# Patient Record
Sex: Male | Born: 1980
Health system: Southern US, Community
[De-identification: ages and names within clinical notes are randomized; demographics above are authoritative.]

## PROBLEM LIST (undated history)

## (undated) DIAGNOSIS — F329 Major depressive disorder, single episode, unspecified: Secondary | ICD-10-CM

## (undated) DIAGNOSIS — Z87442 Personal history of urinary calculi: Secondary | ICD-10-CM

## (undated) DIAGNOSIS — F32A Depression, unspecified: Secondary | ICD-10-CM

## (undated) HISTORY — PX: TONSILLECTOMY: SUR1361

---
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
## 2013-01-17 ENCOUNTER — Emergency Department (HOSPITAL_COMMUNITY): Payer: BC Managed Care – PPO

## 2013-01-17 ENCOUNTER — Encounter (HOSPITAL_COMMUNITY): Payer: Self-pay

## 2013-01-17 ENCOUNTER — Emergency Department (HOSPITAL_COMMUNITY)
Admission: EM | Admit: 2013-01-17 | Discharge: 2013-01-17 | Disposition: A | Discharge status: Home / Self Care | Payer: BC Managed Care – PPO | Attending: Emergency Medicine | Admitting: Emergency Medicine

## 2013-01-17 DIAGNOSIS — R300 Dysuria: Secondary | ICD-10-CM | POA: Insufficient documentation

## 2013-01-17 DIAGNOSIS — N209 Urinary calculus, unspecified: Secondary | ICD-10-CM | POA: Insufficient documentation

## 2013-01-17 DIAGNOSIS — R319 Hematuria, unspecified: Secondary | ICD-10-CM | POA: Insufficient documentation

## 2013-01-17 LAB — CBC WITH DIFFERENTIAL/PLATELET
Basophils Absolute: 0.1 10*3/uL (ref 0.0–0.1)
Basophils Relative: 0 % (ref 0–1)
Eosinophils Absolute: 0.1 10*3/uL (ref 0.0–0.7)
Eosinophils Relative: 1 % (ref 0–5)
HCT: 41.2 % (ref 39.0–52.0)
Hemoglobin: 14.3 g/dL (ref 13.0–17.0)
Lymphocytes Relative: 11 % — ABNORMAL LOW (ref 12–46)
Lymphs Abs: 1.5 10*3/uL (ref 0.7–4.0)
MCH: 30.5 pg (ref 26.0–34.0)
MCHC: 34.7 g/dL (ref 30.0–36.0)
MCV: 87.8 fL (ref 78.0–100.0)
Monocytes Absolute: 1.1 10*3/uL — ABNORMAL HIGH (ref 0.1–1.0)
Monocytes Relative: 8 % (ref 3–12)
Neutro Abs: 11.4 10*3/uL — ABNORMAL HIGH (ref 1.7–7.7)
Neutrophils Relative %: 81 % — ABNORMAL HIGH (ref 43–77)
Platelets: 198 10*3/uL (ref 150–400)
RBC: 4.69 MIL/uL (ref 4.22–5.81)
RDW: 12.8 % (ref 11.5–15.5)
WBC: 14.1 10*3/uL — ABNORMAL HIGH (ref 4.0–10.5)

## 2013-01-17 LAB — BASIC METABOLIC PANEL
CO2: 28 mEq/L (ref 19–32)
Chloride: 102 mEq/L (ref 96–112)
Creatinine, Ser: 0.9 mg/dL (ref 0.50–1.35)

## 2013-01-17 LAB — URINE MICROSCOPIC-ADD ON

## 2013-01-17 LAB — URINALYSIS, ROUTINE W REFLEX MICROSCOPIC
Glucose, UA: NEGATIVE mg/dL
Ketones, ur: 15 mg/dL — AB
pH: 6 (ref 5.0–8.0)

## 2013-01-17 MED ORDER — TAMSULOSIN HCL 0.4 MG PO CAPS: 0.4000 mg | ORAL_CAPSULE | Freq: Every day | ORAL | Status: DC

## 2013-01-17 MED ORDER — OXYCODONE-ACETAMINOPHEN 5-325 MG PO TABS: 1.0000 | ORAL_TABLET | ORAL | Status: DC | PRN

## 2013-01-17 MED ORDER — ONDANSETRON HCL 4 MG/2ML IJ SOLN
4.0000 mg | Freq: Once | INTRAMUSCULAR | Status: AC
  Administered 2013-01-17: 4 mg via INTRAVENOUS
  Filled 2013-01-17: qty 2

## 2013-01-17 MED ORDER — FENTANYL CITRATE 0.05 MG/ML IJ SOLN
50.0000 ug | Freq: Once | INTRAMUSCULAR | Status: AC
  Administered 2013-01-17: 50 ug via INTRAVENOUS
  Filled 2013-01-17: qty 2

## 2013-01-17 MED ORDER — HYDROMORPHONE HCL PF 1 MG/ML IJ SOLN
1.0000 mg | Freq: Once | INTRAMUSCULAR | Status: AC
  Administered 2013-01-17: 1 mg via INTRAVENOUS
  Filled 2013-01-17: qty 1

## 2013-01-17 NOTE — ED Provider Notes (Signed)
Medical screening examination/treatment/procedure(s) were performed by non-physician practitioner and as supervising physician I was immediately available for consultation/collaboration.  Haidy Kackley R. Elona Yinger, MD 01/17/13 0642 

## 2013-01-17 NOTE — ED Notes (Signed)
Pt. Reports left flank pain, n/v. States pain started around 0230. Difficulty urinating.

## 2013-01-17 NOTE — ED Notes (Signed)
PA at bedside.

## 2013-01-17 NOTE — ED Provider Notes (Signed)
History     CSN: [id number]  Arrival date & time 01/17/13  0351   First MD Initiated Contact with Patient 01/17/13 0425      Chief Complaint  Patient presents with  . Flank Pain    (Consider location/radiation/quality/duration/timing/severity/associated sxs/prior treatment) HPI  32 year old male presents to the emergency department with chief complaint of left flank pain.  Patient states that yesterday he was achy and that he had constipation.  He awoke at approximately 2 AM this morning with sudden onset left flank pain.  He states that the pain is severe.  It then began to radiate down to his left groin and testicle.  The patient states he has difficulty urinating and that his urine apparently CT tinged with blood.  The patient denies a history of previous kidney stones.  His father does have a history of kidney stones.  Denies fevers, chills, myalgias, arthralgias. Denies DOE, SOB, chest tightness or pressure, radiation to left arm, jaw or back, or diaphoresis. Denies headaches, light headedness, weakness, visual disturbances. Denies abdominal pain, nausea, vomiting, diarrhea or constipation.   History reviewed. No pertinent past medical history.  History reviewed. No pertinent past surgical history.  No family history on file.  History  Substance Use Topics  . Smoking status: Not on file  . Smokeless tobacco: Not on file  . Alcohol Use: Not on file      Review of Systems  Genitourinary: Positive for dysuria, hematuria and flank pain.  Ten systems reviewed and are negative for acute change, except as noted in the HPI.    Allergies  Review of patient's allergies indicates no known allergies.  Home Medications  No current outpatient prescriptions on file.  BP 126/86  Temp(Src) 98.1 F (36.7 C) (Oral)  Resp 18  SpO2 100%  Physical Exam  Nursing note and vitals reviewed. Constitutional: He appears well-developed and well-nourished. No distress.  HENT:  Head:  Normocephalic and atraumatic.  Eyes: Conjunctivae normal are normal. No scleral icterus.  Neck: Normal range of motion. Neck supple.  Cardiovascular: Normal rate, regular rhythm and normal heart sounds.   Pulmonary/Chest: Effort normal and breath sounds normal. No respiratory distress.  Abdominal: Soft.TTP LLQ. CVA tenderness L side. Musculoskeletal: He exhibits no edema.  Neurological: He is alert.  Skin: Skin is warm and dry. He is not diaphoretic.  Psychiatric: His behavior is normal.    ED Course  Procedures (including critical care time)  Labs Reviewed  CBC WITH DIFFERENTIAL - Abnormal; Notable for the following:    WBC 14.1 (*)    Neutrophils Relative 81 (*)    Neutro Abs 11.4 (*)    Lymphocytes Relative 11 (*)    Monocytes Absolute 1.1 (*)    All other components within normal limits  BASIC METABOLIC PANEL - Abnormal; Notable for the following:    Glucose, Bld 120 (*)    All other components within normal limits  URINALYSIS, ROUTINE W REFLEX MICROSCOPIC - Abnormal; Notable for the following:    Color, Urine BROWN (*)    APPearance CLOUDY (*)    Hgb urine dipstick LARGE (*)    Bilirubin Urine SMALL (*)    Ketones, ur 15 (*)    Protein, ur 30 (*)    Leukocytes, UA SMALL (*)    All other components within normal limits  URINE MICROSCOPIC-ADD ON   No results found.   No diagnosis found.    MDM  5:39 AM BP 114/76  Pulse 83  Temp(Src) 98.1 F (  36.7 C) (Oral)  Resp 18  SpO2 96% Patient with sxs concerning for kidney stone.  I do not suspect testicular torsion-no tenderness to palpation of the testicle. No concern for intestinal blockage, diverticulitis, or AAA. A febrile and unilateral flank pain. I do not suspect pyelonephritis.   6:15 AM Pt has been diagnosed with a Kidney Stone via CT. There is no evidence of significant hydronephrosis, serum creatine WNL, vitals sign stable and the pt does not have irratractable vomiting. Pt will be dc home with pain  medications & has been advised to follow up with PCP.       Arthor Captain, PA-C 01/17/13 [phone]

## 2015-01-15 ENCOUNTER — Other Ambulatory Visit: Payer: Self-pay | Admitting: Orthopaedic Surgery

## 2015-01-15 DIAGNOSIS — M25511 Pain in right shoulder: Secondary | ICD-10-CM

## 2015-01-24 ENCOUNTER — Other Ambulatory Visit: Payer: Self-pay

## 2015-01-25 ENCOUNTER — Ambulatory Visit
Admission: RE | Admit: 2015-01-25 | Discharge: 2015-01-25 | Disposition: A | Discharge status: Home / Self Care | Payer: BLUE CROSS/BLUE SHIELD | Source: Ambulatory Visit | Attending: Orthopaedic Surgery | Admitting: Orthopaedic Surgery

## 2015-01-25 ENCOUNTER — Other Ambulatory Visit: Payer: Self-pay | Admitting: Orthopaedic Surgery

## 2015-01-25 DIAGNOSIS — T1590XA Foreign body on external eye, part unspecified, unspecified eye, initial encounter: Secondary | ICD-10-CM

## 2015-01-25 DIAGNOSIS — M25511 Pain in right shoulder: Secondary | ICD-10-CM

## 2015-01-26 ENCOUNTER — Ambulatory Visit
Admission: RE | Admit: 2015-01-26 | Discharge: 2015-01-26 | Disposition: A | Discharge status: Home / Self Care | Payer: BLUE CROSS/BLUE SHIELD | Source: Ambulatory Visit | Attending: Orthopaedic Surgery | Admitting: Orthopaedic Surgery

## 2015-08-29 ENCOUNTER — Encounter (HOSPITAL_COMMUNITY): Payer: Self-pay | Admitting: Emergency Medicine

## 2015-08-29 ENCOUNTER — Emergency Department (HOSPITAL_COMMUNITY)
Admission: EM | Admit: 2015-08-29 | Discharge: 2015-08-29 | Disposition: A | Discharge status: Home / Self Care | Payer: BLUE CROSS/BLUE SHIELD | Attending: Emergency Medicine | Admitting: Emergency Medicine

## 2015-08-29 ENCOUNTER — Emergency Department (HOSPITAL_COMMUNITY): Payer: BLUE CROSS/BLUE SHIELD

## 2015-08-29 DIAGNOSIS — F430 Acute stress reaction: Secondary | ICD-10-CM | POA: Diagnosis not present

## 2015-08-29 DIAGNOSIS — G253 Myoclonus: Secondary | ICD-10-CM | POA: Diagnosis not present

## 2015-08-29 DIAGNOSIS — Z79899 Other long term (current) drug therapy: Secondary | ICD-10-CM | POA: Diagnosis not present

## 2015-08-29 DIAGNOSIS — F329 Major depressive disorder, single episode, unspecified: Secondary | ICD-10-CM | POA: Insufficient documentation

## 2015-08-29 DIAGNOSIS — R253 Fasciculation: Secondary | ICD-10-CM | POA: Diagnosis present

## 2015-08-29 HISTORY — DX: Depression, unspecified: F32.A

## 2015-08-29 HISTORY — DX: Major depressive disorder, single episode, unspecified: F32.9

## 2015-08-29 LAB — COMPREHENSIVE METABOLIC PANEL
ALBUMIN: 5.2 g/dL — AB (ref 3.5–5.0)
ALT: 49 U/L (ref 17–63)
ANION GAP: 8 (ref 5–15)
AST: 33 U/L (ref 15–41)
Alkaline Phosphatase: 84 U/L (ref 38–126)
BUN: 18 mg/dL (ref 6–20)
CHLORIDE: 103 mmol/L (ref 101–111)
CO2: 25 mmol/L (ref 22–32)
Calcium: 9.6 mg/dL (ref 8.9–10.3)
Creatinine, Ser: 0.91 mg/dL (ref 0.61–1.24)
GFR calc Af Amer: >60 mL/min (ref >60)
Glucose, Bld: 104 mg/dL — ABNORMAL HIGH (ref 65–99)
POTASSIUM: 4 mmol/L (ref 3.5–5.1)
Sodium: 136 mmol/L (ref 135–145)
Total Bilirubin: 1.5 mg/dL — ABNORMAL HIGH (ref 0.3–1.2)
Total Protein: 7.8 g/dL (ref 6.5–8.1)

## 2015-08-29 LAB — CBC WITH DIFFERENTIAL/PLATELET
BASOS ABS: 0.1 10*3/uL (ref 0.0–0.1)
BASOS PCT: 1 %
EOS PCT: 2 %
Eosinophils Absolute: 0.2 10*3/uL (ref 0.0–0.7)
HCT: 44.7 % (ref 39.0–52.0)
Hemoglobin: 15.5 g/dL (ref 13.0–17.0)
Lymphocytes Relative: 28 %
Lymphs Abs: 2.2 10*3/uL (ref 0.7–4.0)
MCH: 30.2 pg (ref 26.0–34.0)
MCHC: 34.7 g/dL (ref 30.0–36.0)
MCV: 87 fL (ref 78.0–100.0)
MONO ABS: 0.7 10*3/uL (ref 0.1–1.0)
Monocytes Relative: 8 %
Neutro Abs: 5 10*3/uL (ref 1.7–7.7)
Neutrophils Relative %: 61 %
PLATELETS: 256 10*3/uL (ref 150–400)
RBC: 5.14 MIL/uL (ref 4.22–5.81)
RDW: 12.3 % (ref 11.5–15.5)
WBC: 8 10*3/uL (ref 4.0–10.5)

## 2015-08-29 LAB — RAPID URINE DRUG SCREEN, HOSP PERFORMED
AMPHETAMINES: NOT DETECTED
BENZODIAZEPINES: NOT DETECTED
Barbiturates: NOT DETECTED
Cocaine: NOT DETECTED
OPIATES: NOT DETECTED
TETRAHYDROCANNABINOL: NOT DETECTED

## 2015-08-29 MED ORDER — CLONAZEPAM 0.5 MG PO TABS: 0.5000 mg | ORAL_TABLET | Freq: Two times a day (BID) | ORAL | Status: DC | PRN

## 2015-08-29 MED ORDER — SODIUM CHLORIDE 0.9 % IV BOLUS (SEPSIS)
1000.0000 mL | Freq: Once | INTRAVENOUS | Status: AC
  Administered 2015-08-29: 1000 mL via INTRAVENOUS

## 2015-08-29 MED ORDER — LORAZEPAM 1 MG PO TABS
1.0000 mg | ORAL_TABLET | Freq: Once | ORAL | Status: DC
  Filled 2015-08-29: qty 1

## 2015-08-29 MED ORDER — LORAZEPAM 2 MG/ML IJ SOLN
1.0000 mg | Freq: Once | INTRAMUSCULAR | Status: AC
  Administered 2015-08-29: 1 mg via INTRAVENOUS
  Filled 2015-08-29: qty 1

## 2015-08-29 NOTE — ED Provider Notes (Signed)
CSN: [id number]     Arrival date & time 08/29/15  0930 History   First MD Initiated Contact with Patient 08/29/15 [phone]     Chief Complaint  Patient presents with  . twitch      (Consider location/radiation/quality/duration/timing/severity/associated sxs/prior Treatment) HPI Comments: Spasms lastng 1sec beginning around 730AM Become more frequent and severe Forceful blinking of left eye, left facial spasm, left hand cramping and squeezing No new medications/drugs No hx of seizure or HAs Under a lot of stress over last month, dad died and a lot of work stress Hx of anxiety/depression/stress for which he is on wellbutrin without change No prior hx of episodes like this No fam hx of neurologic disorders/no hx of huntingtons No recent illness   Past Medical History  Diagnosis Date  . Depression    History reviewed. No pertinent past surgical history. No family history on file. Social History  Substance Use Topics  . Smoking status: Never Smoker   . Smokeless tobacco: None  . Alcohol Use: No    Review of Systems  Constitutional: Negative for fever.  HENT: Negative for sore throat.   Eyes: Negative for visual disturbance.  Respiratory: Negative for shortness of breath.   Cardiovascular: Negative for chest pain.  Gastrointestinal: Negative for nausea, vomiting, abdominal pain, diarrhea and constipation.  Genitourinary: Negative for difficulty urinating.  Musculoskeletal: Negative for back pain and neck stiffness.  Skin: Negative for rash.  Neurological: Positive for facial asymmetry (with blinking/spasm of left face). Negative for dizziness, seizures, syncope, speech difficulty, weakness, light-headedness, numbness and headaches. Tremors: spasm of left face and left hand.      Allergies  Review of patient's allergies indicates no known allergies.  Home Medications   Prior to Admission medications   Medication Sig Start Date End Date Taking? Authorizing Provider   buPROPion (WELLBUTRIN) 100 MG tablet Take 100 mg by mouth 2 (two) times daily.   Yes Historical Provider, MD  ibuprofen (ADVIL,MOTRIN) 200 MG tablet Take 400 mg by mouth every 6 (six) hours as needed for mild pain.   Yes Historical Provider, MD  clonazePAM (KLONOPIN) 0.5 MG tablet Take 1 tablet (0.5 mg total) by mouth 2 (two) times daily as needed for anxiety. 08/29/15   Alvira MondayErin Jamion Carter, MD  oxyCODONE-acetaminophen (PERCOCET) 5-325 MG per tablet Take 1-2 tablets by mouth every 4 (four) hours as needed for pain. Patient not taking: Reported on 08/29/2015 01/17/13   Arthor CaptainAbigail Harris, PA-C  tamsulosin (FLOMAX) 0.4 MG CAPS Take 1 capsule (0.4 mg total) by mouth daily. Patient not taking: Reported on 08/29/2015 01/17/13   Arthor CaptainAbigail Harris, PA-C   BP 113/66 mmHg  Pulse 98  Temp(Src) 98.2 F (36.8 C) (Oral)  Resp 13  SpO2 97% Physical Exam  Constitutional: He is oriented to person, place, and time. He appears well-developed and well-nourished. No distress.  HENT:  Head: Normocephalic and atraumatic.  Eyes: Conjunctivae and EOM are normal.  Neck: Normal range of motion.  Cardiovascular: Normal rate, regular rhythm, normal heart sounds and intact distal pulses.  Exam reveals no gallop and no friction rub.   No murmur heard. Pulmonary/Chest: Effort normal and breath sounds normal. No respiratory distress. He has no wheezes. He has no rales.  Abdominal: Soft. He exhibits no distension. There is no tenderness. There is no guarding.  Musculoskeletal: He exhibits no edema.  Neurological: He is alert and oriented to person, place, and time. He has normal strength. No cranial nerve deficit or sensory deficit. Coordination normal. GCS eye subscore  is 4. GCS verbal subscore is 5. GCS motor subscore is 6.  Normal neurologic exam with exception of frequent spasm of left side of face, twitching, and left hand clenching  Skin: Skin is warm and dry. He is not diaphoretic.  Nursing note and vitals reviewed.   ED  Course  Procedures (including critical care time) Labs Review Labs Reviewed  COMPREHENSIVE METABOLIC PANEL - Abnormal; Notable for the following:    Glucose, Bld 104 (*)    Albumin 5.2 (*)    Total Bilirubin 1.5 (*)    All other components within normal limits  CBC WITH DIFFERENTIAL/PLATELET  URINE RAPID DRUG SCREEN, HOSP PERFORMED    Imaging Review Mr Brain Wo Contrast  08/29/2015  CLINICAL DATA:  Right facial and right hand myoclonus and twitching EXAM: MRI HEAD WITHOUT CONTRAST TECHNIQUE: Multiplanar, multiecho pulse sequences of the brain and surrounding structures were obtained without intravenous contrast. COMPARISON:  None. FINDINGS: Image quality degraded by mild motion Ventricle size normal. Cerebral volume normal. Pituitary normal in size. Negative for Chiari malformation. Negative for acute or chronic infarction Negative for demyelinating disease. Cerebral white matter normal. Brainstem normal Negative for intracranial hemorrhage.  Negative for mass or edema Large retention cyst left max maxillary sinus. Mild mucosal thickening right maxillary sinus. IMPRESSION: Normal MRI of the brain without contrast Sinus mucosal disease. Electronically Signed   By: Marlan Palau M.D.   On: 08/29/2015 11:56   I have personally reviewed and evaluated these images and lab results as part of my medical decision-making.   EKG Interpretation   Date/Time:  Thursday August 29 2015 09:52:19 EDT Ventricular Rate:  113 PR Interval:  154 QRS Duration: 105 QT Interval:  347 QTC Calculation: 476 R Axis:   -73 Text Interpretation:  Sinus tachycardia Probable left atrial enlargement  Left axis deviation RSR' in V1 or V2, probably normal variant Artifact  Confirmed by [facility] MD, Char Feltman ([id number]) on 08/29/2015 6:44:51 PM      MDM   Final diagnoses:  Myoclonic jerking  Acute stress disorder   34 year old male with history of anxiety and Wellbutrin presents with concern of left facial and left  hand twitching which began this morning.  Denies any cocaine or amphetamine use, and UDS negative.   No headache and doubt intracranial hemorrhage.  Electrolytes are within normal limits.  No recent change in medications, and doubt serotonin syndrome.  Given patient with focal myoclonic jerking, which may represent partial seizures with possible intracranial focus, MRI brain was ordered.  Other etiologies of symptoms include a stress reaction or conversion disorder. Doubt Creutzfeldt-jakob disease, other abnormalities.   MRI obtained showing no sign of intracranial abnormality. Given setting of patient under severe acute distress, and not acknowledging the symptoms initially, conversion disorder is in the differential diagnosis. Discussed with patient and family, and recommend outpatient neurology follow-up for evaluation of other etiologies of myoclonus.  Given short course of clonazepam.  Discussed reasons to return in detail. Patient discharged in stable condition with understanding of reasons to return.     Alvira Monday, MD 08/29/15 [phone]

## 2015-08-29 NOTE — ED Notes (Signed)
Per co-worker, states she noticed facial twitching and his voice changing-sounded like he has he hiccups-patient is unaware of symptoms

## 2015-08-29 NOTE — Discharge Instructions (Signed)
°  Myoclonus °Myoclonus is a term that refers to brief, involuntary twitching or jerking of a muscle or a group of muscles. It describes a symptom, and generally, is not a diagnosis of a disease. The myoclonic twitches or jerks are usually caused by sudden muscle contractions. They also can result from brief lapses of contraction. Myoclonic twitches or jerks may occur: °· Alone or in sequence. °· In a pattern or without pattern. °· Infrequently or many times each minute. °Often times, myoclonus is one of several symptoms in a wide variety of nervous system disorders such as: °· Multiple sclerosis. °· Parkinson's disease. °· Alzheimer's disease. °· Creutzfeldt-Jakob disease. °Familiar examples of normal myoclonus include: °· Hiccups and jerks. °· "Sleep starts" that some people have while drifting off to sleep. °Severe cases can severely limit a person's ability to: °· Eat. °· Talk. °· Walk. °Myoclonic jerks commonly occur in individuals with epilepsy. The most common types of myoclonus include: °· Action. °· Cortical reflex. °· Essential. °· Palatal. °· Progressive myoclonus epilepsy. °· Reticular reflex. °· Sleep. °· Stimulus-sensitive. °TREATMENT  °Treatment for myoclonus consists of medicines that may help reduce symptoms. These drugs (many of which are also used to treat epilepsy) include:  °· Barbiturates. °· Clonazepam. °· Phenytoin. °· Primidone. °· Sodium valproate. °The complex origins of myoclonus may require the use of multiple drugs. °  °This information is not intended to replace advice given to you by your health care provider. Make sure you discuss any questions you have with your health care provider. °  °Document Released: 10/23/2002 Document Revised: 01/25/2012 Document Reviewed: 10/05/2013 °Elsevier Interactive Patient Education ©2016 Elsevier Inc. ° ° °

## 2015-08-29 NOTE — ED Notes (Signed)
MD at bedside. 

## 2015-08-29 NOTE — Progress Notes (Addendum)
[facility] ED CM noted pt with coverage but no pcp listed  [facility] ED CM spoke with pt on how to obtain an in network pcp with insurance coverage via the customer service number or web site  Cm reviewed ED level of care for crisis/emergent services and community pcp level of care to manage continuous or chronic medical concerns.  The pt voiced understanding CM encouraged pt and discussed pt's responsibility to verify with pt's insurance carrier that any recommended medical provider offered by any emergency room or a hospital provider is within the carrier's network. The pt voiced understanding  Male family members x 2 at bedside states pt is under a lot of stress related to work and wonder if his s/s could be related. 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  One male stated "ain't nothing wrong with me" 'he don't go to church" Females discussed a male family friend they may "ask to speak with him" Cm informed her that family counseling is for supporting the pt Confirms pt has been seen by Herb Graysammy Spear  CM updated EPD on family voiced concerns

## 2015-09-02 ENCOUNTER — Encounter: Payer: Self-pay | Admitting: Neurology

## 2015-09-02 ENCOUNTER — Ambulatory Visit (INDEPENDENT_AMBULATORY_CARE_PROVIDER_SITE_OTHER): Payer: BLUE CROSS/BLUE SHIELD | Admitting: Neurology

## 2015-09-02 VITALS — BP 124/83 | HR 106 | Ht 67.0 in | Wt 157.5 lb

## 2015-09-02 DIAGNOSIS — G253 Myoclonus: Secondary | ICD-10-CM | POA: Insufficient documentation

## 2015-09-02 MED ORDER — SERTRALINE HCL 50 MG PO TABS: ORAL_TABLET | ORAL | Status: DC

## 2015-09-02 NOTE — Patient Instructions (Addendum)
   We will stop the Wellbutrin and start Zoloft 50 mg one tablet daily for 1 week, then take 2 tablets daily.   Nonepileptic Seizures Nonepileptic seizures are seizures that are not caused by abnormal electrical signals in your brain. These seizures often seem like epileptic seizures, but they are not caused by epilepsy.  There are two types of nonepileptic seizures:  A physiologic nonepileptic seizure results from a disruption in your brain.  A psychogenic seizure results from emotional stress. These seizures are sometimes called pseudoseizures. CAUSES  Causes of physiologic nonepileptic seizures include:   Sudden drop in blood pressure.  Low blood sugar.  Low levels of salt (sodium) in your blood.  Low levels of calcium in your blood.  Migraine.  Heart rhythm problems.  Sleep disorders.  Drug and alcohol abuse. Common causes of psychogenic nonepileptic seizures include:  Stress.  Emotional trauma.  Sexual or physical abuse.  Major life events, such as divorce or the death of a loved one.  Mental health disorders, including panic attack and hyperactivity disorder. SIGNS AND SYMPTOMS A nonepileptic seizure can look like an epileptic seizure, including uncontrollable shaking (convulsions), or changes in attention, behavior, or the ability to remain awake and alert. However, there are some differences. Nonepileptic seizures usually:  Do not cause physical injuries.  Start slowly.  Include crying or shrieking.  Last longer than 2 minutes.  Have a short recovery time without headache or exhaustion. DIAGNOSIS  Your health care provider can usually diagnose nonepileptic seizures after taking your medical history and giving you a physical exam. Your health care provider may want to talk to your friends or relatives who have seen you have a seizure.  You may also need to have tests to look for causes of physiologic nonepileptic seizures. This may include an  electroencephalogram (EEG), which is a test that measures electrical activity in your brain. If you have had an epileptic seizure, the results of your EEG will be abnormal. If your health care provider thinks you have had a psychogenic nonepileptic seizure, you may need to see a mental health specialist for an evaluation. TREATMENT  Treatment depends on the type and cause of your seizures.  For physiologic nonepileptic seizures, treatment is aimed at addressing the underlying condition that caused the seizures. These seizures usually stop when the underlying condition is properly treated.  Nonepileptic seizures do not respond to the seizure medicines used to treat epilepsy.  For psychogenic seizures, you may need to work with a mental health specialist. HOME CARE INSTRUCTIONS Home care will depend on the type of nonepileptic seizures you have.   Follow all your health care provider's instructions.  Keep all your follow-up appointments. SEEK MEDICAL CARE IF: You continue to have seizures after treatment. SEEK IMMEDIATE MEDICAL CARE IF:  Your seizures change or become more frequent.  You injure yourself during a seizure.  You have one seizure after another.  You have trouble recovering from a seizure.  You have chest pain or trouble breathing. MAKE SURE YOU:  Understand these instructions.  Will watch your condition.  Will get help right away if you are not doing well or get worse.   This information is not intended to replace advice given to you by your health care provider. Make sure you discuss any questions you have with your health care provider.   Document Released: 12/18/2005 Document Revised: 11/23/2014 Document Reviewed: 08/29/2013 Elsevier Interactive Patient Education [employer]2016 Elsevier Inc.

## 2015-09-02 NOTE — Progress Notes (Signed)
Reason for visit: Left face and arm jerking  Referring physician: [place]  ADDIE CEDERBERG is a 34 y.o. male  History of present illness:  Mr. Goodchild is a 34 year old right-handed white male with a history of onset of some twitching of the left face and some clenching of the left fist that began around 08/29/2015. The patient went to the emergency room for an evaluation. MRI of the brain was done, this was completely unremarkable. The patient began having a stuttering speech pattern about 5 hours later. The patient has had persistent speech issues, and some occasional left-sided twitching that has occurred since that time. He has reports some numbness of the left side of the head. He denies any true weakness, he denies any balance issues or falls, and he denies any problems controlling the bowels or the bladder. He denies any previous episodes similar to this. He is sent to this office for further evaluation. Within the last month or 2, the patient's father committed suicide. The patient has been under increased stress.  Past Medical History  Diagnosis Date  . Depression     History reviewed. No pertinent past surgical history.  Family History  Problem Relation Age of Onset  . Heart attack Father   . Stroke Father   . Seizures Maternal Uncle   . Anxiety disorder Maternal Grandmother     Social history:  reports that he has never smoked. He has never used smokeless tobacco. He reports that he does not drink alcohol or use illicit drugs.  Medications:  Prior to Admission medications   Medication Sig Start Date End Date Taking? Authorizing Provider  buPROPion (WELLBUTRIN) 100 MG tablet Take 100 mg by mouth 2 (two) times daily.   Yes Historical Provider, MD  clonazePAM (KLONOPIN) 0.5 MG tablet Take 1 tablet (0.5 mg total) by mouth 2 (two) times daily as needed for anxiety. 08/29/15  Yes Alvira Monday, MD     No Known Allergies  ROS:  Out of a complete 14 system review of  symptoms, the patient complains only of the following symptoms, and all other reviewed systems are negative.  Fatigue Slurred speech, tremor Depression, too much sleep, decreased energy, disinterest in activities, suicidal thoughts, racing thoughts  Blood pressure 124/83, pulse 106, height  (1.702 m), weight 157 lb 8 oz (71.442 kg).  Physical Exam  General: The patient is alert and cooperative at the time of the examination.  Eyes: Pupils are equal, round, and reactive to light. Discs are flat bilaterally.  Neck: The neck is supple, no carotid bruits are noted.  Respiratory: The respiratory examination is clear.  Cardiovascular: The cardiovascular examination reveals a regular rate and rhythm, no obvious murmurs or rubs are noted.  Skin: Extremities are without significant edema.  Neurologic Exam  Mental status: The patient is alert and oriented x 3 at the time of the examination. The patient has apparent normal recent and remote memory, with an apparently normal attention span and concentration ability.  Cranial nerves: Facial symmetry is present. There is good sensation of the face to pinprick and soft touch bilaterally. The strength of the facial muscles and the muscles to head turning and shoulder shrug are normal bilaterally. Speech is associated with a stuttering quality, no aphasia is noted. Extraocular movements are full. Visual fields are full. The tongue is midline, and the patient has symmetric elevation of the soft palate. No obvious hearing deficits are noted.  Motor: The motor testing reveals 5 over  5 strength of all 4 extremities. Good symmetric motor tone is noted throughout.  Sensory: Sensory testing is intact to pinprick, soft touch, vibration sensation, and position sense on all 4 extremities. No evidence of extinction is noted.  Coordination: Cerebellar testing reveals good finger-nose-finger and heel-to-shin bilaterally.  Gait and station: Gait is normal.  Tandem gait is slightly unsteady. Romberg is negative. No drift is seen.  Reflexes: Deep tendon reflexes are symmetric and normal bilaterally. Toes are downgoing bilaterally.    MRI brain 08/29/15:  IMPRESSION: Normal MRI of the brain without contrast  Sinus mucosal disease.  * MRI scan images were reviewed online. I agree with the written report.    Assessment/Plan:  1. Left sided jerking, stuttering speech  The patient likely has psychogenic symptoms. The patient likely has significant stress associated with the recent death of his father. He has been given clonazepam through the emergency room, but this results in significant sedation. The patient has stopped the Wellbutrin, we will switch him to Zoloft and increase the dose. He will follow-up in 3-4 months. An EEG study will be done. If the patient does not improve, a referral to a psychiatrist or psychologist may be indicated. I discussed the diagnosis with the patient.  Marlan Palau. Keith Maximiano Lott MD 09/02/2015 8:00 PM  [facility] [address] [place], [place] [id number]  Phone [phone] Fax [phone]

## 2015-09-03 ENCOUNTER — Telehealth: Payer: Self-pay | Admitting: Neurology

## 2015-09-03 NOTE — Telephone Encounter (Signed)
Patient's mother is calling. The patient was seen yesterday in our office and was given a Rx for sertraline (ZOLOFT) 50 MG tablet. This medication has caused the patient to have diarrhea and makes the patient feel scared. Please call and discuss. Thank you.

## 2015-09-03 NOTE — Telephone Encounter (Signed)
The patient took one dose of the Zoloft, he has had some diarrhea issues and feels nervous. I have suggested that they try to continue with the medication for least a week or 10 days, he continues to have issues with tolerance, we will go on to another medication such as Prozac.

## 2015-09-18 ENCOUNTER — Telehealth: Payer: Self-pay | Admitting: Neurology

## 2015-09-18 MED ORDER — SERTRALINE HCL 50 MG PO TABS: 150.0000 mg | ORAL_TABLET | Freq: Every day | ORAL | Status: DC

## 2015-09-18 NOTE — Telephone Encounter (Signed)
Patient's mother is calling regarding the patient. She states the patient is still having slurred speech and is not acting like he should. The patient seems to be in his own little world. He goes in and out of this mood. Would this be coming from the medication sertraline (ZOLOFT) 50 MG tablet ?  Please call to discuss. Thank you.

## 2015-09-18 NOTE — Telephone Encounter (Signed)
I have spoken with Juston's mother this afternoon.  Sts. she and Jill AlexandersJustin have been arguing today. She sts. Will's speech had cleared up, but it seems to become slurred when he is upset or anxious.  Sts. he also has times when he just stares, "like he's tuning out to the world and you just have to about shake him to get him out of it."  Sts. gi issues with Zoloft have resolved.  She is concerned he's not that much better./fim

## 2015-09-18 NOTE — Telephone Encounter (Signed)
The patient is still having some issues with irritability, he will occasionally stutter when he is under stress. We will increase Zoloft to 150 mg a day, he is no longer having diarrhea on the medication. If this does not help, the patient may require a referral to a psychiatrist, he may require other medication such as Abilify added to the Zoloft.

## 2015-10-08 ENCOUNTER — Other Ambulatory Visit: Payer: BLUE CROSS/BLUE SHIELD

## 2015-10-30 ENCOUNTER — Ambulatory Visit (INDEPENDENT_AMBULATORY_CARE_PROVIDER_SITE_OTHER): Payer: BLUE CROSS/BLUE SHIELD | Admitting: Neurology

## 2015-10-30 ENCOUNTER — Telehealth: Payer: Self-pay | Admitting: Neurology

## 2015-10-30 DIAGNOSIS — G253 Myoclonus: Secondary | ICD-10-CM

## 2015-10-30 DIAGNOSIS — R258 Other abnormal involuntary movements: Secondary | ICD-10-CM | POA: Diagnosis not present

## 2015-10-30 NOTE — Procedures (Signed)
    History:  Ralph ScaleJustin Barajas is a 34 year old gentleman with onset of left fist clenching and left facial twitching that began around 08/29/2015. MRI of the brain was unremarkable. The patient is been under stress, and he is being evaluated for possible focal seizures.  This is a routine EEG. No skull defects are noted. Medications include clonazepam and Zoloft.   EEG classification: Normal awake and drowsy  Description of the recording: The background rhythms of this recording consists of a fairly well modulated medium amplitude alpha rhythm of 9 Hz that is reactive to eye opening and closure. As the record progresses, the patient appears to remain in the waking state throughout the recording. Photic stimulation was performed, resulting in a bilateral and symmetric photic driving response. Hyperventilation was also performed, resulting in a minimal buildup of the background rhythm activities without significant slowing seen. Toward the end of the recording, the patient enters the drowsy state with slight symmetric slowing seen. The patient never enters stage II sleep. At no time during the recording does there appear to be evidence of spike or spike wave discharges or evidence of focal slowing. EKG monitor shows no evidence of cardiac rhythm abnormalities with a heart rate of 78.  Impression: This is a normal EEG recording in the waking and drowsy state. No evidence of ictal or interictal discharges are seen.

## 2015-10-30 NOTE — Telephone Encounter (Signed)
I called the patient. The EEG study was unremarkable. Towards the end, there was some muscle artifact, but no epileptiform activity. The patient indicates the still has some episodes of stuttering, slight confusion that will come and go. He overall is doing some better however.

## 2016-01-17 ENCOUNTER — Ambulatory Visit: Payer: Self-pay | Admitting: Neurology

## 2016-01-22 ENCOUNTER — Encounter: Payer: Self-pay | Admitting: Neurology

## 2017-03-08 ENCOUNTER — Emergency Department (HOSPITAL_COMMUNITY)
Admission: EM | Admit: 2017-03-08 | Discharge: 2017-03-08 | Disposition: A | Discharge status: Other Acute Inpatient Hospital | Payer: BLUE CROSS/BLUE SHIELD | Attending: Emergency Medicine | Admitting: Emergency Medicine

## 2017-03-08 ENCOUNTER — Encounter (HOSPITAL_COMMUNITY): Payer: Self-pay

## 2017-03-08 ENCOUNTER — Inpatient Hospital Stay (HOSPITAL_COMMUNITY)
Admission: AD | Admit: 2017-03-08 | Discharge: 2017-03-12 | DRG: 885 | Disposition: A | Discharge status: Home / Self Care | Payer: BLUE CROSS/BLUE SHIELD | Source: Intra-hospital | Attending: Psychiatry | Admitting: Psychiatry

## 2017-03-08 ENCOUNTER — Encounter (HOSPITAL_COMMUNITY): Payer: Self-pay | Admitting: Emergency Medicine

## 2017-03-08 DIAGNOSIS — G253 Myoclonus: Secondary | ICD-10-CM | POA: Diagnosis present

## 2017-03-08 DIAGNOSIS — Z630 Problems in relationship with spouse or partner: Secondary | ICD-10-CM

## 2017-03-08 DIAGNOSIS — F332 Major depressive disorder, recurrent severe without psychotic features: Secondary | ICD-10-CM | POA: Diagnosis present

## 2017-03-08 DIAGNOSIS — G4700 Insomnia, unspecified: Secondary | ICD-10-CM | POA: Diagnosis present

## 2017-03-08 DIAGNOSIS — Z79899 Other long term (current) drug therapy: Secondary | ICD-10-CM | POA: Diagnosis not present

## 2017-03-08 DIAGNOSIS — Z658 Other specified problems related to psychosocial circumstances: Secondary | ICD-10-CM

## 2017-03-08 DIAGNOSIS — Z635 Disruption of family by separation and divorce: Secondary | ICD-10-CM

## 2017-03-08 DIAGNOSIS — R45851 Suicidal ideations: Secondary | ICD-10-CM

## 2017-03-08 DIAGNOSIS — Z818 Family history of other mental and behavioral disorders: Secondary | ICD-10-CM

## 2017-03-08 LAB — CBC
HEMATOCRIT: 44.1 % (ref 39.0–52.0)
HEMOGLOBIN: 15.7 g/dL (ref 13.0–17.0)
MCH: 30.5 pg (ref 26.0–34.0)
MCHC: 35.6 g/dL (ref 30.0–36.0)
MCV: 85.6 fL (ref 78.0–100.0)
Platelets: 266 10*3/uL (ref 150–400)
RBC: 5.15 MIL/uL (ref 4.22–5.81)
RDW: 12.5 % (ref 11.5–15.5)
WBC: 10.2 10*3/uL (ref 4.0–10.5)

## 2017-03-08 LAB — ETHANOL: Alcohol, Ethyl (B): <5 mg/dL (ref <5)

## 2017-03-08 LAB — RAPID URINE DRUG SCREEN, HOSP PERFORMED
Amphetamines: NOT DETECTED
BARBITURATES: NOT DETECTED
BENZODIAZEPINES: NOT DETECTED
COCAINE: NOT DETECTED
Opiates: NOT DETECTED
TETRAHYDROCANNABINOL: NOT DETECTED

## 2017-03-08 LAB — COMPREHENSIVE METABOLIC PANEL
ALBUMIN: 4.8 g/dL (ref 3.5–5.0)
ALT: 57 U/L (ref 17–63)
ANION GAP: 8 (ref 5–15)
AST: 34 U/L (ref 15–41)
Alkaline Phosphatase: 73 U/L (ref 38–126)
BUN: 14 mg/dL (ref 6–20)
CHLORIDE: 104 mmol/L (ref 101–111)
CO2: 25 mmol/L (ref 22–32)
Calcium: 9.2 mg/dL (ref 8.9–10.3)
Creatinine, Ser: 0.87 mg/dL (ref 0.61–1.24)
GFR calc Af Amer: >60 mL/min (ref >60)
GFR calc non Af Amer: >60 mL/min (ref >60)
GLUCOSE: 109 mg/dL — AB (ref 65–99)
POTASSIUM: 3.9 mmol/L (ref 3.5–5.1)
SODIUM: 137 mmol/L (ref 135–145)
Total Bilirubin: 1 mg/dL (ref 0.3–1.2)
Total Protein: 7.9 g/dL (ref 6.5–8.1)

## 2017-03-08 LAB — SALICYLATE LEVEL: Salicylate Lvl: <7 mg/dL (ref 2.8–30.0)

## 2017-03-08 LAB — ACETAMINOPHEN LEVEL

## 2017-03-08 MED ORDER — ALUM & MAG HYDROXIDE-SIMETH 200-200-20 MG/5ML PO SUSP: 30.0000 mL | ORAL | Status: DC | PRN

## 2017-03-08 MED ORDER — ONDANSETRON 4 MG PO TBDP: 4.0000 mg | ORAL_TABLET | Freq: Three times a day (TID) | ORAL | Status: DC | PRN

## 2017-03-08 MED ORDER — ZOLPIDEM TARTRATE 5 MG PO TABS: 5.0000 mg | ORAL_TABLET | Freq: Every evening | ORAL | Status: DC | PRN

## 2017-03-08 MED ORDER — TRAZODONE HCL 100 MG PO TABS: 100.0000 mg | ORAL_TABLET | Freq: Every evening | ORAL | Status: DC | PRN

## 2017-03-08 MED ORDER — ACETAMINOPHEN 325 MG PO TABS: 650.0000 mg | ORAL_TABLET | Freq: Four times a day (QID) | ORAL | Status: DC | PRN

## 2017-03-08 MED ORDER — MAGNESIUM HYDROXIDE 400 MG/5ML PO SUSP: 30.0000 mL | Freq: Every day | ORAL | Status: DC | PRN

## 2017-03-08 MED ORDER — ACETAMINOPHEN 325 MG PO TABS
650.0000 mg | ORAL_TABLET | Freq: Four times a day (QID) | ORAL | Status: DC | PRN
  Administered 2017-03-10: 650 mg via ORAL
  Filled 2017-03-08: qty 2

## 2017-03-08 MED ORDER — CALCIUM CARBONATE ANTACID 500 MG PO CHEW
1.0000 | CHEWABLE_TABLET | Freq: Once | ORAL | Status: DC
  Filled 2017-03-08: qty 1

## 2017-03-08 MED ORDER — IBUPROFEN 600 MG PO TABS: 600.0000 mg | ORAL_TABLET | Freq: Three times a day (TID) | ORAL | Status: DC | PRN

## 2017-03-08 MED ORDER — IBUPROFEN 200 MG PO TABS: 600.0000 mg | ORAL_TABLET | Freq: Three times a day (TID) | ORAL | Status: DC | PRN

## 2017-03-08 NOTE — BH Assessment (Addendum)
Assessment Note  Ralph Barajas is an 36 y.o. male. He presents to [facility] with depressive symptoms and suicidal thoughts. He reports feelings of hopelessness, isolating self from others, fatigue, and loss of interest in usual pleasures. He is suicidal with plan to shoot himself in the head. He has access to guns. Sts that he has several guns in his home. He collects guns as a hobby. He is concerned that this admission will effect his ability to maintain his gun permit. Patient has no history of suicidal attempts. Trigger for suicidal ideations is conflict with his mother. Sts that he doesn't get along with his mother and they work together so he has to deal with her everyday. No HI or AVH's. No history of INPT hospitalizations. No mental health outpatient providers noted. No alcohol or drug use reported.   Diagnosis: Major Depressive Disorder, Recurrent, Severe without psychotic features  Past Medical History:  Past Medical History:  Diagnosis Date  . Depression     History reviewed. No pertinent surgical history.  Family History:  Family History  Problem Relation Age of Onset  . Heart attack Father   . Stroke Father   . Seizures Maternal Uncle   . Anxiety disorder Maternal Grandmother     Social History:  reports that he has never smoked. He has never used smokeless tobacco. He reports that he does not drink alcohol or use drugs.  Additional Social History:  Alcohol / Drug Use History of alcohol / drug use?: No history of alcohol / drug abuse  CIWA: CIWA-Ar BP: (!) 137/91 Pulse Rate: (!) 101 COWS:    Allergies: No Known Allergies  Home Medications:  (Not in a hospital admission)  OB/GYN Status:  No LMP for male patient.  General Assessment Data Location of Assessment: [facility] TTS Assessment: In system Is this a Tele or Face-to-Face Assessment?: Face-to-Face Is this an Initial Assessment or a Re-assessment for this encounter?: Initial Assessment Marital status: Single Maiden  name:  (n/a) Is patient pregnant?: No Pregnancy Status: No Living Arrangements: Alone Can pt return to current living arrangement?: Yes Admission Status: Voluntary Is patient capable of signing voluntary admission?: Yes Referral Source: Self/Family/Friend Insurance type:  Herbalist)     Crisis Care Plan Living Arrangements: Alone Legal Guardian: Other: (no legal guardian ) Name of Psychiatrist:  (no psychiatrist) Name of Therapist:  (no therapist )  Education Status Is patient currently in school?: No Highest grade of school patient has completed:  (some college) Name of school:  (n/a)  Risk to self with the past 6 months Suicidal Ideation: Yes-Currently Present Has patient been a risk to self within the past 6 months prior to admission? : Yes Suicidal Intent: Yes-Currently Present Has patient had any suicidal intent within the past 6 months prior to admission? : Yes Is patient at risk for suicide?: Yes Suicidal Plan?: Yes-Currently Present Has patient had any suicidal plan within the past 6 months prior to admission? : Yes Specify Current Suicidal Plan:  (shoot self in head with gun ) Access to Means: Yes Specify Access to Suicidal Means:  (patient has several guns; has gun collection ) What has been your use of drugs/alcohol within the last 12 months?:  (denies ) Previous Attempts/Gestures: No How many times?:  (0) Other Self Harm Risks:  (denies ) Triggers for Past Attempts: Other (Comment) (no previous hx) Intentional Self Injurious Behavior: None Family Suicide History: Unknown Recent stressful life event(s): Other (Comment) (conflict with mother ) Persecutory voices/beliefs?: No  Depression: Yes Depression Symptoms: Feeling angry/irritable, Feeling worthless/self pity, Guilt, Fatigue, Isolating Substance abuse history and/or treatment for substance abuse?: No Suicide prevention information given to non-admitted patients: Not applicable  Risk to Others within the past  6 months Homicidal Ideation: No Does patient have any lifetime risk of violence toward others beyond the six months prior to admission? : No Thoughts of Harm to Others: No Current Homicidal Intent: No Current Homicidal Plan: No Access to Homicidal Means: No Identified Victim:  (n/a) History of harm to others?: No Assessment of Violence: None Noted Violent Behavior Description:  (calm and cooperative) Does patient have access to weapons?: No Criminal Charges Pending?: No Does patient have a court date: No Is patient on probation?: No  Psychosis Hallucinations: None noted Delusions: None noted  Mental Status Report Appearance/Hygiene: In scrubs Eye Contact: Poor Motor Activity: Freedom of movement Speech: Logical/coherent Level of Consciousness: Alert Mood: Depressed, Anxious Affect: Appropriate to circumstance Anxiety Level: None Thought Processes: Relevant, Coherent Judgement: Impaired Orientation: Person, Place, Time, Situation Obsessive Compulsive Thoughts/Behaviors: None  Cognitive Functioning Concentration: Decreased Memory: Recent Intact, Remote Intact IQ: Average Insight: Poor Impulse Control: Poor Appetite: Poor Weight Loss:  (none reported) Weight Gain:  (none reported) Sleep: Decreased Total Hours of Sleep:  (varies ) Vegetative Symptoms: None  ADLScreening [facility] Assessment Services) Patient's cognitive ability adequate to safely complete daily activities?: Yes Patient able to express need for assistance with ADLs?: Yes Independently performs ADLs?: Yes (appropriate for developmental age)  Prior Inpatient Therapy Prior Inpatient Therapy: No Prior Therapy Dates:  (n/a) Prior Therapy Facilty/Provider(s):  (n/a) Reason for Treatment:  (n/a)  Prior Outpatient Therapy Prior Outpatient Therapy: No Prior Therapy Dates:  (n/a) Prior Therapy Facilty/Provider(s):  (n/a) Reason for Treatment:  (n/a) Does patient have an ACCT team?: No Does patient have  Intensive In-House Services?  : No Does patient have [facility] services? : No Does patient have P4CC services?: No  ADL Screening (condition at time of admission) Patient's cognitive ability adequate to safely complete daily activities?: Yes Patient able to express need for assistance with ADLs?: Yes Independently performs ADLs?: Yes (appropriate for developmental age)             Merchant navy officer (For Healthcare) Does Patient Have a Medical Advance Directive?: No Would patient like information on creating a medical advance directive?: No - Patient declined    Additional Information 1:1 In Past 12 Months?: No CIRT Risk: No Elopement Risk: No Does patient have medical clearance?: No     Disposition:  Disposition Initial Assessment Completed for this Encounter: Yes Disposition of Patient: Inpatient treatment program Nanine Means, DNP, recommends INPT treatment )  On Site Evaluation by:   Reviewed with Physician:    Melynda Ripple 03/08/2017 5:05 PM

## 2017-03-08 NOTE — ED Provider Notes (Signed)
[facility]Y [facility]PT Provider Note   CSN: [id number] Arrival date & time: 03/08/17  1532     History   Chief Complaint Chief Complaint  Patient presents with  . Suicidal    HPI Ralph Barajas is a 36 y.o. male with a PMHx of depression/anxiety and myoclonic jerks, who presents to the ED with complaints of suicidal ideations with a plan of "taking a pistol and ending it all". Patient states that he has access to guns, collects them. Reports that this has been going on for quite some time, states that he typically feels better for a while and then life happens and gets him down amounts and has suicidal ideations returned. He has recently had quite a bit of stress in her life and is separated from his girlfriend which has caused him to feel some worthlessness. He was previously on Wellbutrin and Zoloft at different times but he didn't like taking them so he is no longer taking any medications. He does not see a therapist. He denies HI or AVH, illicit drug use, alcohol use, or smoking cigarettes. He has no medical complaints at this time and he is here voluntarily.   The history is provided by the patient and medical records. No language interpreter was used.  Mental Health Problem  Presenting symptoms: depression and suicidal thoughts   Presenting symptoms: no hallucinations and no homicidal ideas   Onset quality:  Gradual Timing:  Constant Progression:  Worsening Chronicity:  Recurrent Context: noncompliance and stressful life event   Context: not alcohol use and not drug abuse   Treatment compliance:  Untreated Relieved by:  None tried Exacerbated by: stressful life events. Ineffective treatments:  None tried Associated symptoms: no abdominal pain and no chest pain   Risk factors: hx of mental illness     Past Medical History:  Diagnosis Date  . Depression     Patient Active Problem List   Diagnosis Date Noted  . Myoclonus 09/02/2015    History reviewed. No pertinent  surgical history.     Home Medications    Prior to Admission medications   Medication Sig Start Date End Date Taking? Authorizing Provider  clonazePAM (KLONOPIN) 0.5 MG tablet Take 1 tablet (0.5 mg total) by mouth 2 (two) times daily as needed for anxiety. 08/29/15   Alvira Monday, MD  sertraline (ZOLOFT) 50 MG tablet Take 3 tablets (150 mg total) by mouth daily. 09/18/15   York Spaniel, MD    Family History Family History  Problem Relation Age of Onset  . Heart attack Father   . Stroke Father   . Seizures Maternal Uncle   . Anxiety disorder Maternal Grandmother     Social History Social History  Substance Use Topics  . Smoking status: Never Smoker  . Smokeless tobacco: Never Used  . Alcohol use No     Allergies   Patient has no known allergies.   Review of Systems Review of Systems  Constitutional: Negative for chills and fever.  Respiratory: Negative for shortness of breath.   Cardiovascular: Negative for chest pain.  Gastrointestinal: Negative for abdominal pain, constipation, diarrhea, nausea and vomiting.  Genitourinary: Negative for dysuria and hematuria.  Musculoskeletal: Negative for arthralgias and myalgias.  Skin: Negative for color change.  Allergic/Immunologic: Negative for immunocompromised state.  Neurological: Negative for weakness and numbness.  Psychiatric/Behavioral: Positive for suicidal ideas. Negative for confusion, hallucinations and homicidal ideas.   All other systems reviewed and are negative for acute change except as noted in  the HPI.    Physical Exam Updated Vital Signs BP (!) 137/91 (BP Location: Left Arm)   Pulse (!) 101   Temp 98.4 F (36.9 C) (Oral)   Resp 18   Ht  (1.702 m)   Wt 72.6 kg   SpO2 98%   BMI 25.06 kg/m   Physical Exam  Constitutional: He is oriented to person, place, and time. Vital signs are normal. He appears well-developed and well-nourished.  Non-toxic appearance. No distress.  Afebrile,  nontoxic, NAD  HENT:  Head: Normocephalic and atraumatic.  Mouth/Throat: Oropharynx is clear and moist and mucous membranes are normal.  Eyes: Conjunctivae and EOM are normal. Right eye exhibits no discharge. Left eye exhibits no discharge.  Neck: Normal range of motion. Neck supple.  Cardiovascular: Normal rate, regular rhythm, normal heart sounds and intact distal pulses.  Exam reveals no gallop and no friction rub.   No murmur heard. Pulmonary/Chest: Effort normal and breath sounds normal. No respiratory distress. He has no decreased breath sounds. He has no wheezes. He has no rhonchi. He has no rales.  Abdominal: Soft. Normal appearance and bowel sounds are normal. He exhibits no distension. There is no tenderness. There is no rigidity, no rebound, no guarding, no CVA tenderness, no tenderness at McBurney's point and negative Murphy's sign.  Musculoskeletal: Normal range of motion.  Neurological: He is alert and oriented to person, place, and time. He has normal strength. No sensory deficit.  Skin: Skin is warm, dry and intact. No rash noted.  Psychiatric: He is not actively hallucinating. He exhibits a depressed mood. He expresses suicidal ideation. He expresses no homicidal ideation. He expresses suicidal plans. He expresses no homicidal plans.  Depressed affect, tears in his eyes at times during the conversation. Endorsing SI with a plan. Denies HI/AVH. Not responding to internal stimuli  Nursing note and vitals reviewed.    ED Treatments / Results  Labs (all labs ordered are listed, but only abnormal results are displayed) Labs Reviewed  COMPREHENSIVE METABOLIC PANEL - Abnormal; Notable for the following:       Result Value   Glucose, Bld 109 (*)    All other components within normal limits  ACETAMINOPHEN LEVEL - Abnormal; Notable for the following:    Acetaminophen (Tylenol), Serum <10 (*)    All other components within normal limits  ETHANOL  SALICYLATE LEVEL  CBC  RAPID  URINE DRUG SCREEN, HOSP PERFORMED    EKG  EKG Interpretation None       Radiology No results found.  Procedures Procedures (including critical care time)  Medications Ordered in ED Medications  acetaminophen (TYLENOL) tablet 650 mg (not administered)  ibuprofen (ADVIL,MOTRIN) tablet 600 mg (not administered)  ondansetron (ZOFRAN-ODT) disintegrating tablet 4 mg (not administered)  zolpidem (AMBIEN) tablet 5 mg (not administered)  alum & mag hydroxide-simeth (MAALOX/MYLANTA) 200-200-20 MG/5ML suspension 30 mL (not administered)     Initial Impression / Assessment and Plan / ED Course  I have reviewed the triage vital signs and the nursing notes.  Pertinent labs & imaging results that were available during my care of the patient were reviewed by me and considered in my medical decision making (see chart for details).     36 y.o. male here with SI with a plan, ongoing for quite some time but has gotten more overwhelming recently. +stressful life events recently. Denies HI/AVH, drugs/EtOH use, or smoking cigarettes. Previously on meds but didn't like them, not taking anything for depression. No other complaints at  this time, exam benign aside from depressed affect. Will get med clearance labs and reassess after.   5:47 PM EtOH undetectable. CBC WNL. CMP WNL. Salicylate and acetaminophen levels WNL. UDS neg. Pt medically cleared at this time. Psych hold orders placed. Please see TTS notes for further documentation of care/dispo. PLEASE NOTE THAT PT IS HERE VOLUNTARILY AT THIS TIME, IF PT TRIES TO LEAVE THEY WOULD NEED IVC PAPERWORK TAKEN OUT. Pt stable at time of med clearance.     Final Clinical Impressions(s) / ED Diagnoses   Final diagnoses:  Suicidal ideation    New Prescriptions New Prescriptions   No medications on file     [address], PA-C 03/08/17 1748    Loren Racer, MD 03/09/17 2329

## 2017-03-08 NOTE — ED Triage Notes (Signed)
Patient is from home.  Patient states he having thoughts of harming himself, no plan.  Denies hallucinations.  Patient states he lost his father over a year ago, recently separated from girlfriend, and feeling like he is disappointing others.

## 2017-03-08 NOTE — ED Notes (Signed)
Pt A&O x 3, no distress noted, calm & cooperative.  Pending report to [facility] and [facility] transport. Monitoring for safety, Q 15 min checks in effect. 

## 2017-03-08 NOTE — ED Notes (Signed)
Bed: [facility] Expected date:  Expected time:  Means of arrival:  Comments: 

## 2017-03-08 NOTE — ED Notes (Signed)
Bed: [facility] Expected date:  Expected time:  Means of arrival:  Comments: T[facility] 29

## 2017-03-08 NOTE — Progress Notes (Signed)
Ralph Barajas is a 36 year old male being admitted voluntarily to [facility] from [facility].  He came to the ED with with depression and suicidal thoughts to shoot self in the head. He reported feeling of hopeless, isolating self from others, fatigue, and loss of interest in usual pleasures.  He reported his recent stressor is his relationship with his mother.  He also states that his father killed himself august 2016 and he was his best friend.  He has history of being on antidepressants in the past but hasn't been on any medication in over a year.  He denies any medical issues and appears to be in no physical distress.  He denies current suicidal ideation and will contract for safety on the unit.  He denies HI or A/V hallucinations.  Oriented him to the unit.  Admission paperwork completed and signed.  Belongings searched and secured in locker # 42.  Skin assessment completed and no skin issues noted.  Q 15 minute checks initiated for safety.  We will monitor the progress towards his goals.

## 2017-03-08 NOTE — Progress Notes (Signed)
Patient ID: Ralph Barajas, male   DOB: 04-24-1981, 36 y.o.   MRN: [id number] Received report from Wynn Banker. RN. Writer introduced self to client, encouraged him to report any concerns, alerted client medications to help him sleep wear available. Client declined. Staff will monitor q53min for safety. Client is safe on the unit.

## 2017-03-08 NOTE — Tx Team (Signed)
Initial Treatment Plan 03/08/2017 10:17 PM JONH MCQUEARY [id number]    PATIENT STRESSORS: Marital or family conflict Occupational concerns   PATIENT STRENGTHS: Capable of independent living Metallurgist fund of knowledge Work skills   PATIENT IDENTIFIED PROBLEMS: Depression  Suicidal ideation  Anxiety  "I want to be less depressed"  "Get back on medicines"             DISCHARGE CRITERIA:  Improved stabilization in mood, thinking, and/or behavior Motivation to continue treatment in a less acute level of care Verbal commitment to aftercare and medication compliance  PRELIMINARY DISCHARGE PLAN: Outpatient therapy Medication management  PATIENT/FAMILY INVOLVEMENT: This treatment plan has been presented to and reviewed with the patient, RYDEN WAINER.  The patient and family have been given the opportunity to ask questions and make suggestions.  Levin Bacon, RN 03/08/2017, 10:17 PM

## 2017-03-08 NOTE — ED Notes (Signed)
Report called to RN Herbert Seta, [facility].  Pending [facility] transport at 9pm.

## 2017-03-09 MED ORDER — SERTRALINE HCL 25 MG PO TABS
25.0000 mg | ORAL_TABLET | Freq: Once | ORAL | Status: AC
  Administered 2017-03-09: 25 mg via ORAL
  Filled 2017-03-09: qty 1

## 2017-03-09 MED ORDER — SERTRALINE HCL 50 MG PO TABS
50.0000 mg | ORAL_TABLET | Freq: Every day | ORAL | Status: DC
  Administered 2017-03-10 – 2017-03-12 (×3): 50 mg via ORAL
  Filled 2017-03-09 (×5): qty 1

## 2017-03-09 MED ORDER — TRAZODONE HCL 50 MG PO TABS: 50.0000 mg | ORAL_TABLET | Freq: Every evening | ORAL | Status: DC | PRN

## 2017-03-09 NOTE — Progress Notes (Signed)
DAR NOTE: Pt present with flat affect and depressed mood in the unit. Pt has been isolating himself and has been bed most of the time. Pt denies physical pain, took all his meds as scheduled. As per self inventory, pt had a fair night sleep, fair appetite, low  energy, and poor concentration. Pt rate depression at 5, hopeless ness at 5, and anxiety at 0. Pt's safety ensured with 15 minute and environmental checks. Pt currently denies SI/HI and A/V hallucinations. Pt verbally agrees to seek staff if SI/HI or A/VH occurs and to consult with staff before acting on these thoughts. Will continue POC.

## 2017-03-09 NOTE — Progress Notes (Signed)
Recreation Therapy Notes  Date: 03/09/17 Time: 1000 Location: [address] Hall Dayroom  Group Topic: Coping Skills  Goal Area(s) Addresses:  Patients will be able to identify the benefits of coping skills. Patients will be able to identify activities that can be used as coping skills.  Intervention: [employer], dry erase marker, eraser, various activities in a can   Activity: Electrical engineer.  Patients were broken up into teams.  One person would come up at a time, pull a slip of paper from the can with an activity on it.  The person would then draw a picture of the activity on the board.  The team got one minute to guess the picture.  If the team did not guess the picture, the other team got a chance to steal the point.  Education: Pharmacologist, Building control surveyor.   Education Outcome: Acknowledges understanding/In group clarification offered/Needs additional education.   Clinical Observations/Feedback: Pt did not attend group.     Caroll Rancher, LRT/CTRS         Caroll Rancher A 03/09/2017 12:57 PM

## 2017-03-09 NOTE — Tx Team (Signed)
Interdisciplinary Treatment and Diagnostic Plan Update  03/09/2017 Time of Session: 0930 KRISTAPHER DUBUQUE MRN: [id number]  Principal Diagnosis:  MDD  Secondary Diagnoses: Active Problems:   Major depressive disorder, recurrent severe without psychotic features (HCC)   Current Medications:  Current Facility-Administered Medications  Medication Dose Route Frequency Provider Last Rate Last Dose  . acetaminophen (TYLENOL) tablet 650 mg  650 mg Oral Q6H PRN Charm Rings, NP      . alum & mag hydroxide-simeth (MAALOX/MYLANTA) 200-200-20 MG/5ML suspension 30 mL  30 mL Oral PRN Charm Rings, NP      . ibuprofen (ADVIL,MOTRIN) tablet 600 mg  600 mg Oral Q8H PRN Charm Rings, NP      . magnesium hydroxide (MILK OF MAGNESIA) suspension 30 mL  30 mL Oral Daily PRN Charm Rings, NP      . ondansetron (ZOFRAN-ODT) disintegrating tablet 4 mg  4 mg Oral Q8H PRN Charm Rings, NP      . traZODone (DESYREL) tablet 100 mg  100 mg Oral QHS PRN Charm Rings, NP       PTA Medications: No prescriptions prior to admission.    Patient Stressors: Marital or family conflict Occupational concerns  Patient Strengths: Capable of independent living Metallurgist fund of knowledge Work skills  Treatment Modalities: Medication Management, Group therapy, Case management,  1 to 1 session with clinician, Psychoeducation, Recreational therapy.   Physician Treatment Plan for Primary Diagnosis: MDD  Medication Management: Evaluate patient's response, side effects, and tolerance of medication regimen.  Therapeutic Interventions: 1 to 1 sessions, Unit Group sessions and Medication administration.  Evaluation of Outcomes: Progressing  Physician Treatment Plan for Secondary Diagnosis: Active Problems:   Major depressive disorder, recurrent severe without psychotic features (HCC)  Medication Management: Evaluate patient's response, side effects, and tolerance of  medication regimen.  Therapeutic Interventions: 1 to 1 sessions, Unit Group sessions and Medication administration.  Evaluation of Outcomes: Progressing   RN Treatment Plan for Primary Diagnosis: MDD Long Term Goal(s): Knowledge of disease and therapeutic regimen to maintain health will improve  Short Term Goals: Ability to remain free from injury will improve, Ability to verbalize feelings will improve and Ability to disclose and discuss suicidal ideas  Medication Management: RN will administer medications as ordered by provider, will assess and evaluate patient's response and provide education to patient for prescribed medication. RN will report any adverse and/or side effects to prescribing provider.  Therapeutic Interventions: 1 on 1 counseling sessions, Psychoeducation, Medication administration, Evaluate responses to treatment, Monitor vital signs and CBGs as ordered, Perform/monitor CIWA, COWS, AIMS and Fall Risk screenings as ordered, Perform wound care treatments as ordered.  Evaluation of Outcomes: Progressing   LCSW Treatment Plan for Primary Diagnosis: MDD Long Term Goal(s): Safe transition to appropriate next level of care at discharge, Engage patient in therapeutic group addressing interpersonal concerns.  Short Term Goals: Engage patient in aftercare planning with referrals and resources, Facilitate patient progression through stages of change regarding substance use diagnoses and concerns and Identify triggers associated with mental health/substance abuse issues  Therapeutic Interventions: Assess for all discharge needs, 1 to 1 time with Social worker, Explore available resources and support systems, Assess for adequacy in community support network, Educate family and significant other(s) on suicide prevention, Complete Psychosocial Assessment, Interpersonal group therapy.  Evaluation of Outcomes: Progressing   Progress in Treatment: Attending groups: No. New to unit.  Continuing to assess.  Participating in groups: No. Taking medication  as prescribed: Yes. Toleration medication: Yes. Family/Significant other contact made: No, will contact:  family member if patient consents Patient understands diagnosis: Yes. Discussing patient identified problems/goals with staff: Yes. Medical problems stabilized or resolved: Yes. Denies suicidal/homicidal ideation: Yes. Issues/concerns per patient self-inventory: No. Other: n/a   New problem(s) identified: Yes, Describe:  Pt reports plan to shoot self prior to admission; collects and owns several guns in home. CSW to follow-up.  New Short Term/Long Term Goal(s): medication stabilization; elimination of SI thoughts; development of comprehensive mental wellness/sobriety plan.   Discharge Plan or Barriers: CSW assessing for appropriate referrals.   Reason for Continuation of Hospitalization: Depression Medication stabilization Suicidal ideation  Estimated Length of Stay: 3-5 days   Attendees: Patient: 03/09/2017 8:36 AM  Physician: Dr. Jackquline Berlin MD 03/09/2017 8:36 AM  Nursing: Boyd Kerbs RN 03/09/2017 8:36 AM  RN Care Manager: Onnie Boer CM 03/09/2017 8:36 AM  Social Worker: Trula Slade, LCSW 03/09/2017 8:36 AM  Recreational Therapist: Juliann Pares 03/09/2017 8:36 AM  Other:  Armandina Stammer NP 03/09/2017 8:36 AM  Other:  03/09/2017 8:36 AM  Other: 03/09/2017 8:36 AM    Scribe for Treatment Team: Ledell Peoples Smart, LCSW 03/09/2017 8:36 AM

## 2017-03-09 NOTE — Progress Notes (Signed)
Adult Psychoeducational Group Note  Date:  03/09/2017 Time:  9:42 PM  Group Topic/Focus:  Wrap-Up Group:   The focus of this group is to help patients review their daily goal of treatment and discuss progress on daily workbooks.  Participation Level:  Minimal  Participation Quality:  Appropriate  Affect:  Appropriate  Cognitive:  Oriented  Insight: Appropriate  Engagement in Group:  Engaged  Modes of Intervention:  Socialization and Support  Additional Comments:  Patient attended and participated in group tonight. He reports that today his mother and a good friend visited with him. He walked around on the halls, took his medication and talked with his doctor.  Lita Mains [facility] 03/09/2017, 9:42 PM

## 2017-03-09 NOTE — Plan of Care (Signed)
Problem: Activity: Goal: Interest or engagement in activities will improve Outcome: Not Progressing Pt did not attend the recreational therapy group.

## 2017-03-09 NOTE — [facility] Suicide Risk Assessment (Signed)
[facility] INPATIENT:  Family/Significant Other Suicide Prevention Education  Suicide Prevention Education:  Education Completed; Jaivion Kingsley (pt's mother) [phone] has been identified by the patient as the family member/significant other with whom the patient will be residing, and identified as the person(s) who will aid the patient in the event of a mental health crisis (suicidal ideations/suicide attempt).  With written consent from the patient, the family member/significant other has been provided the following suicide prevention education, prior to the and/or following the discharge of the patient.  The suicide prevention education provided includes the following:  Suicide risk factors  Suicide prevention and interventions  National Suicide Hotline telephone number  [facility] assessment telephone number  [facility] Emergency Assistance 911  [facility] and/or Residential Mobile Crisis Unit telephone number  Request made of family/significant other to:  Remove weapons (e.g., guns, rifles, knives), all items previously/currently identified as safety concern.    Remove drugs/medications (over-the-counter, prescriptions, illicit drugs), all items previously/currently identified as a safety concern.  The family member/significant other verbalizes understanding of the suicide prevention education information provided.  The family member/significant other agrees to remove the items of safety concern listed above.   Pt's mother and sister in law are taking his guns from home and locking them at their house in their safe prior to pt's discharge home.  Patriciann Becht N Smart LCSW 03/09/2017, 11:56 AM

## 2017-03-09 NOTE — [facility] Group Notes (Signed)
[facility] LCSW Group Therapy  03/09/2017 2:19 PM   Type of Therapy:  Group Therapy  Participation Level:  Active  Participation Quality:  Attentive  Affect:  Appropriate  Cognitive:  Appropriate  Insight:  Improving  Engagement in Therapy:  Engaged  Modes of Intervention:  Clarification, Education, Exploration and Socialization  Summary of Progress/Problems: Today's group focused on resilience. Stayed the entire time, engaged throughout.  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  It's hot, dirty work, but it's been good to me.  I hope to inherit the business from my mom some day, just like she got it from my grandfather." Ralph Barajas 03/09/2017 , 2:19 PM

## 2017-03-09 NOTE — [facility] Counselor (Signed)
Adult Comprehensive Assessment  Patient ID: Ralph Barajas, male   DOB: 1981-05-07, 36 y.o.   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 Housing / Lack of housing: lives in his own home Physical health (include injuries & life threatening diseases): none identified--pt's family reports that pt has ticks when under extreme stress or experiencing anxiety or anger/frustration.  Social relationships: few close friends; pt tends to isolate at times Substance abuse: none reported Bereavement / Loss: pt's father commit suicide 2 years ago.   Living/Environment/Situation:  Living Arrangements: Alone Living conditions (as described by patient or guardian): lives alone in his home How long has patient lived in current situation?: several years.  What is atmosphere in current home: Comfortable  Family History:  Marital status: Divorced Divorced, when?: 7 years ago What types of issues is patient dealing with in the relationship?: "she just wasn't happy with me."  Additional relationship information: "we got married young. She also had a misscarriage which was really hard on [place]."  Are you sexually active?: Yes What is your sexual orientation?: heterosexual Has your sexual activity been affected by drugs, alcohol, medication, or emotional stress?: no  Does patient have children?: No (pt reports his exwife and exgirlfriend both had miscarriages)  Childhood History:  By whom was/is the patient raised?: Both parents Additional childhood history information: good childhood. father was depressed chronically  and commit suicide in 2016. enmeshed relationship with his mother Description of patient's relationship with caregiver when they were a child: close to both parents; especially his mother Patient's description of current relationship with people who raised him/her: strained with his mother; continues to have enmeshed relationship "we are just like each other."  How were you disciplined when you got in trouble as a child/adolescent?: n/a  Does patient have siblings?: Yes Number of Siblings: 2 Description of patient's current relationship with siblings: 2 halfbrothers. "we are okay and work together."  Did patient suffer any verbal/emotional/physical/sexual abuse as a child?: No Did patient suffer from severe childhood neglect?: No Has patient ever been sexually abused/assaulted/raped as an adolescent or adult?: No Was the patient ever a victim of a crime or a disaster?: No Witnessed domestic violence?: No Has patient been effected by domestic violence as an adult?: No  Education:  Highest grade of school patient has completed: high school and some trade school  Currently a student?: No Name of school: n/a  Learning disability?: No  Employment/Work Situation:   Employment situation: Employed Where is patient currently employed?: 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  Patient's job has been impacted by current illness: Yes Describe how patient's job has been impacted: finds it difficult to work with his mother when he is agitated/depressed and easily aggravated by her.  What is the longest time patient has a held a job?: above  Where was the patient employed at that time?: above  Has patient ever been in the [employer]?: No Has patient ever served in combat?: No Did You Receive Any Psychiatric Treatment/Services While in Equities trader?: No Are There Guns or Other Weapons in Your Home?: No Are These Weapons  Safely Secured?: Yes (PATIENT  COLLECTS GUNS AND HAS GUN SAFE AT HIS MOTHER'S HOME. PT ALSO HAS SEVERAL GUNS IN HIS HOME FOR PROTECTION. CSW CONFIRMED WITH PT'S MOTHER AND SISTER IN LAW THAT THEY WILL BE REMOVING GUNS FROM HIS HOME AND LOCKING THEM UP IN SAFE PRIOR TO HIS D/C)  Financial Resources:   Financial resources: Income from employment, Private insurance Does patient have a representative payee or guardian?: No  Alcohol/Substance Abuse:   What has been your use of drugs/alcohol within the last 12 months?: some social drinking. "very rare that I even drink one beer." UDS/BAL neg  If attempted suicide, did drugs/alcohol play a role in this?: No Alcohol/Substance Abuse Treatment Hx: Denies past history If yes, describe treatment: n/a  Has alcohol/substance abuse ever caused legal problems?: No  Social Support System:   Patient's Community Support System: Good Describe Community Support System: pt reports that he has a few very close friends Type of faith/religion: Ephriam Knuckles How does patient's faith help to cope with current illness?: prayer  Leisure/Recreation:   Leisure and Hobbies: watching racing; working at [employer] really enjoy the work I do."   Strengths/Needs:   What things does the patient do well?: "I'm a hard worker."  In what areas does patient struggle / problems for patient: coping with grief from father's suicide 2 years ago; coping with depression and SI thoughts.   Discharge Plan:   Does patient have access to transportation?: Yes (family/license and car) Will patient be returning to same living situation after discharge?: Yes (pt plans to return home) Currently receiving community mental health services: No If no, would patient like referral for services when discharged?: Yes (What county?) ([place]-referral to [facility] likely) Does patient have financial barriers related to discharge medications?: No (income and private insurance)  Summary/Recommendations:   Summary and  Recommendations (to be completed by the evaluator): Patient is 36 yo male living in [place], [place] Pt has a primary diagnosis of Major Depressive Disorder, Severe. He presents to the hospital seeking treatment for SI thoughts, increased depression/mood lability, and for medication stabilization. Patient reports stressors surrounding the relationship with his mother, work, and the death of his father 2 years ago by suicide/grief issues. Patient denies substance abuse/alcohol abuse. He reports ongoing depression and SI. Pt denies SI/HI/AVH. Recommendations for patient include: crisis stabilization, therapeutic milieu, encourage group attendance and participation, medication management for mood stabilization, and development of comprehensive mental wellness plan. CSW assessing for appropriate referrals.   Ledell Peoples Smart LCSW 03/09/2017 12:36 PM

## 2017-03-09 NOTE — [facility] Suicide Risk Assessment (Signed)
[facility] Admission Suicide Risk Assessment   Nursing information obtained from:  Patient Demographic factors:  Male, Caucasian, Living alone, Access to firearms Current Mental Status:  NA Loss Factors:  Loss of significant relationship Historical Factors:  Family history of suicide, Family history of mental illness or substance abuse Risk Reduction Factors:  Employed  Total Time spent with patient: 30 minutes Principal Problem: MDD recurrent Diagnosis:   Patient Active Problem List   Diagnosis Date Noted  . Major depressive disorder, recurrent severe without psychotic features (HCC) [F33.2] 03/08/2017  . 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 Presented to the ER in company of his friend. Reports suicidal thoughts. Has thoughts of shooting himself. Patient has access to multiple weapons. Patient reports main stressor as recent break-up with his girlfriend and poor relationship with his mom. He reports worsening symptoms of depression. Has guilty ruminations, has lost confidence in himself and feels he is a disappointment to everyone. Significantly, patients father committed suicide in August 2016. UDS, BAL and routine labs are all within normal limits.   At interview, patient states that his mom runs the family business. Says he feels humiliated sometimes at work. Feels he is not fully respected at work. Has a work related test in a week. Says he failed it the last time. Patient reports feeling tired all the time. He stays at home once he is back from work. Says he feels disgusted with things and easily gets agitated. Recently his mom has been trying to have him go out and do things. Reports normal appetite, no changes in his weight. Says he is able to focus on task. He loves to sleep a lot. No substance use. No associated psychosis. No evidence of mania. No overwhelming anxiety. Patient denies any plans to kill  self. Says he just wants to get back on medication and get back to work. He loves his job. Says that is all he has cared for all his life. No thoughts of homicide. No thoughts of violence.     Continued Clinical Symptoms:  Alcohol Use Disorder Identification Test Final Score (AUDIT): 0 The "Alcohol Use Disorders Identification Test", Guidelines for Use in Primary Care, Second Edition.  World Science writer [facility]). Score between 0-7:  no or low risk or alcohol related problems. Score between 8-15:  moderate risk of alcohol related problems. Score between 16-19:  high risk of alcohol related problems. Score 20 or above:  warrants further diagnostic evaluation for alcohol dependence and treatment.   CLINICAL FACTORS:  Depression Family history of suicide.    Musculoskeletal: Strength & Muscle Tone: within normal limits Gait & Station: normal Patient leans: N/A  Psychiatric Specialty Exam: Physical Exam As in H&P  ROS As in H&P  Blood pressure 109/77, pulse 94, temperature 98.1 F (36.7 C), temperature source Oral, resp. rate 16, height  (1.702 m), weight 71.2 kg (157 lb).Body mass index is 24.59 kg/m.  General Appearance: As in H&P  Eye Contact:  As in H&P  Speech:  As in H&P  Volume:  As in H&P  Mood:  As in H&P  Affect:  As in H&P  Thought Process:  As in H&P  Orientation:  As in H&P  Thought Content:  As in H&P  Suicidal Thoughts:  As in H&P  Homicidal Thoughts:  As in H&P  Memory:  As in H&P  Judgement:  As in H&P  Insight:  As  in H&P  Psychomotor Activity:  As in H&P  Concentration:  As in H&P  Recall:  As in H&P  Fund of Knowledge:  As in H&P  Language:  Good  Akathisia:  No  Handed:    AIMS (if indicated):     Assets:  As in H&P  ADL's:  Impaired  Cognition:  WNL  Sleep:  Number of Hours: 6      COGNITIVE FEATURES THAT CONTRIBUTE TO RISK:  None    SUICIDE RISK:   Moderate:  Frequent suicidal ideation with limited intensity, and duration, some  specificity in terms of plans, no associated intent, good self-control, limited dysphoria/symptomatology, some risk factors present, and identifiable protective factors, including available and accessible social support.  PLAN OF CARE:  As in H&P   I certify that inpatient services furnished can reasonably be expected to improve the patient's condition.   Georgiann Cocker, MD 03/09/2017, 11:14 AM

## 2017-03-09 NOTE — H&P (Signed)
Psychiatric Admission Assessment Adult  Patient Identification: Ralph Barajas MRN:  [id number] Date of Evaluation:  03/09/2017 Chief Complaint:  Bipolar disorder Principal Diagnosis: <principal problem not specified> Diagnosis:   Patient Active Problem List   Diagnosis Date Noted  . Major depressive disorder, recurrent severe without psychotic features ([place]) [F33.2] 03/08/2017  . 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 Presented to the ER in company of his friend. Reports suicidal thoughts. Has thoughts of shooting himself. Patient has access to multiple weapons. Patient reports main stressor as recent break-up with his girlfriend and poor relationship with his mom. He reports worsening symptoms of depression. Has guilty ruminations, has lost confidence in himself and feels he is a disappointment to everyone. Significantly, patients father committed suicide in August 2016. UDS, BAL and routine labs are all within normal limits.   At interview, patient states that his mom runs the family business. Says he feels humiliated sometimes at work. Feels he is not fully respected at work. Has a work related test in a week. Says he failed it the last time. Patient reports feeling tired all the time. He stays at home once he is back from work. Says he feels disgusted with things and easily gets agitated. Recently his mom has been trying to have him go out and do things. Reports normal appetite, no changes in his weight. Says he is able to focus on task. He loves to sleep a lot. No substance use. No associated psychosis. No evidence of mania. No overwhelming anxiety. Patient denies any plans to kill self. Says he just wants to get back on medication and get back to work. He loves his job. Says that is all he has cared for all his life. No thoughts of homicide. No thoughts of violence.    Associated  Signs/Symptoms: Depression Symptoms:  As above (Hypo) Manic Symptoms:  As above Anxiety Symptoms:  As above Psychotic Symptoms:  As above PTSD Symptoms: Negative Total Time spent with patient: 1 hour  Past Psychiatric History: Depression after his father committed suicide. He was treated by his PCP with Sertraline and Welbutrin. Says he responded well to Sertraline. Discontinued treatment as he felt better. No past psychiatric hospitalization. No past suicidal behavior. No past history of violent behavior. Patient has lots of weapons at home. Denies any plans of using it in a dangerous way.   Is the patient at risk to self? Yes.    Has the patient been a risk to self in the past 6 months? No.  Has the patient been a risk to self within the distant past? No.  Is the patient a risk to others? No.  Has the patient been a risk to others in the past 6 months? No.  Has the patient been a risk to others within the distant past? No.   Prior Inpatient Therapy:   Prior Outpatient Therapy:    Alcohol Screening: 1. How often do you have a drink containing alcohol?: Never 9. Have you or someone else been injured as a result of your drinking?: No 10. Has a relative or friend or a doctor or another health worker been concerned about your drinking or suggested you cut down?: No Alcohol Use Disorder Identification Test Final Score (AUDIT): 0 Brief Intervention: AUDIT score less than 7 or less-screening does not suggest unhealthy drinking-brief intervention not indicated Substance Abuse History in the last 12 months:  No. Consequences  of Substance Abuse: Negative Previous Psychotropic Medications: Yes  Psychological Evaluations: No  Past Medical History:  Past Medical History:  Diagnosis Date  . Depression    History reviewed. No pertinent surgical history. Family History:  Family History  Problem Relation Age of Onset  . Heart attack Father   . Stroke Father   . Seizures Maternal Uncle   .  Anxiety disorder Maternal Grandmother    Family Psychiatric  History: Father committed suicide. Reports some history of depression.  Tobacco Screening: Have you used any form of tobacco in the last 30 days? (Cigarettes, Smokeless Tobacco, Cigars, and/or Pipes): No Social History:  History  Alcohol Use No     History  Drug Use No    Additional Social History:      Pain Medications: none Prescriptions: none Over the Counter: none History of alcohol / drug use?: No history of alcohol / drug abuse    Patient reports good childhood. He was raised by his biological parents. His family inherited a business from his grandfather. Patient was well adjusted at school. He graduated and then went to [employer] school. 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 Patient was married for two years. They divorced 8 years ago. His last relationship was a year ago. Says both women were not his type. Says they loved to party while he is the quiet type. Patient reports loss of pregnancy from both relationship. This was a main stressor in the relationships. His mom wants him to start dating again. No kids. No legal issues. No financial stressors. His houses are fully paid for. Has a half brother. Some degree of sibling rivalry. Minimal support as he is alienated from his extended family.    Allergies:  No Known Allergies Lab Results:  Results for orders placed or performed during the hospital encounter of 03/08/17 (from the past 48 hour(s))  Comprehensive metabolic panel     Status: Abnormal   Collection Time: 03/08/17  4:36 PM  Result Value Ref Range   Sodium 137 135 - 145 mmol/L   Potassium 3.9 3.5 - 5.1 mmol/L   Chloride 104 101 - 111 mmol/L   CO2 25 22 - 32 mmol/L   Glucose, Bld 109 (H) 65 - 99 mg/dL   BUN 14 6 - 20 mg/dL   Creatinine, Ser 0.87 0.61 - 1.24 mg/dL   Calcium 9.2 8.9 - 10.3 mg/dL   Total Protein 7.9 6.5 - 8.1 g/dL   Albumin 4.8 3.5 - 5.0 g/dL   AST 34 15 - 41 U/L   ALT 57 17 - 63  U/L   Alkaline Phosphatase 73 38 - 126 U/L   Total Bilirubin 1.0 0.3 - 1.2 mg/dL   GFR calc non Af Amer >60 >60 mL/min   GFR calc Af Amer >60 >60 mL/min    Comment: (NOTE) The eGFR has been calculated using the CKD EPI equation. This calculation has not been validated in all clinical situations. eGFR's persistently <60 mL/min signify possible Chronic Kidney Disease.    Anion gap 8 5 - 15  Ethanol     Status: None   Collection Time: 03/08/17  4:36 PM  Result Value Ref Range   Alcohol, Ethyl (B) <5 <5 mg/dL    Comment:        LOWEST DETECTABLE LIMIT FOR SERUM ALCOHOL IS 5 mg/dL FOR MEDICAL PURPOSES ONLY   Salicylate level     Status: None   Collection Time: 03/08/17  4:36 PM  Result Value Ref Range   Salicylate Lvl <1.6 2.8 - 30.0 mg/dL  Acetaminophen level     Status: Abnormal   Collection Time: 03/08/17  4:36 PM  Result Value Ref Range   Acetaminophen (Tylenol), Serum <10 (L) 10 - 30 ug/mL    Comment:        THERAPEUTIC CONCENTRATIONS VARY SIGNIFICANTLY. A RANGE OF 10-30 ug/mL MAY BE AN EFFECTIVE CONCENTRATION FOR MANY PATIENTS. HOWEVER, SOME ARE BEST TREATED AT CONCENTRATIONS OUTSIDE THIS RANGE. ACETAMINOPHEN CONCENTRATIONS >150 ug/mL AT 4 HOURS AFTER INGESTION AND >50 ug/mL AT 12 HOURS AFTER INGESTION ARE OFTEN ASSOCIATED WITH TOXIC REACTIONS.   cbc     Status: None   Collection Time: 03/08/17  4:36 PM  Result Value Ref Range   WBC 10.2 4.0 - 10.5 K/uL   RBC 5.15 4.22 - 5.81 MIL/uL   Hemoglobin 15.7 13.0 - 17.0 g/dL   HCT 44.1 39.0 - 52.0 %   MCV 85.6 78.0 - 100.0 fL   MCH 30.5 26.0 - 34.0 pg   MCHC 35.6 30.0 - 36.0 g/dL   RDW 12.5 11.5 - 15.5 %   Platelets 266 150 - 400 K/uL  Rapid urine drug screen (hospital performed)     Status: None   Collection Time: 03/08/17  4:36 PM  Result Value Ref Range   Opiates NONE DETECTED NONE DETECTED   Cocaine NONE DETECTED NONE DETECTED   Benzodiazepines NONE DETECTED NONE DETECTED   Amphetamines NONE DETECTED NONE  DETECTED   Tetrahydrocannabinol NONE DETECTED NONE DETECTED   Barbiturates NONE DETECTED NONE DETECTED    Comment:        DRUG SCREEN FOR MEDICAL PURPOSES ONLY.  IF CONFIRMATION IS NEEDED FOR ANY PURPOSE, NOTIFY LAB WITHIN 5 DAYS.        LOWEST DETECTABLE LIMITS FOR URINE DRUG SCREEN Drug Class       Cutoff (ng/mL) Amphetamine      1000 Barbiturate      200 Benzodiazepine   109 Tricyclics       604 Opiates          300 Cocaine          300 THC              50     Blood Alcohol level:  Lab Results  Component Value Date   ETH <5 54/07/8118    Metabolic Disorder Labs:  No results found for: HGBA1C, MPG No results found for: PROLACTIN No results found for: CHOL, TRIG, HDL, CHOLHDL, VLDL, LDLCALC  Current Medications: Current Facility-Administered Medications  Medication Dose Route Frequency Provider Last Rate Last Dose  . acetaminophen (TYLENOL) tablet 650 mg  650 mg Oral Q6H PRN Patrecia Pour, NP      . alum & mag hydroxide-simeth (MAALOX/MYLANTA) 200-200-20 MG/5ML suspension 30 mL  30 mL Oral PRN Patrecia Pour, NP      . ibuprofen (ADVIL,MOTRIN) tablet 600 mg  600 mg Oral Q8H PRN Patrecia Pour, NP      . magnesium hydroxide (MILK OF MAGNESIA) suspension 30 mL  30 mL Oral Daily PRN Patrecia Pour, NP      . ondansetron (ZOFRAN-ODT) disintegrating tablet 4 mg  4 mg Oral Q8H PRN Patrecia Pour, NP      . traZODone (DESYREL) tablet 100 mg  100 mg Oral QHS PRN Patrecia Pour, NP       PTA Medications: No prescriptions prior to admission.    Musculoskeletal: Strength & Muscle  Tone: within normal limits Gait & Station: normal Patient leans: N/A  Psychiatric Specialty Exam: Physical Exam  Constitutional: He is oriented to person, place, and time. He appears well-developed and well-nourished.  HENT:  Head: Normocephalic and atraumatic.  Eyes: Conjunctivae are normal. Pupils are equal, round, and reactive to light.  Neck: Normal range of motion. Neck supple.   Cardiovascular: Normal rate, regular rhythm and normal heart sounds.   Respiratory: Effort normal and breath sounds normal.  GI: Soft. Bowel sounds are normal.  Musculoskeletal: Normal range of motion.  Neurological: He is alert and oriented to person, place, and time. He has normal reflexes.  Skin: Skin is warm and dry.  Psychiatric:  As above     ROS  Blood pressure 109/77, pulse 94, temperature 98.1 F (36.7 C), temperature source Oral, resp. rate 16, height '5\' 7"'  (1.702 m), weight 71.2 kg (157 lb).Body mass index is 24.59 kg/m.  General Appearance: In bed, poor grooming, warmed up as interview progressed. Withdrawn. Not internally stimulated.   Eye Contact:  Minimal  Speech:  Soft spoken. Slow rate   Volume:  Decreased  Mood:  Depressed  Affect:  Blunted and mood congruent.   Thought Process:  Linear  Orientation:  Full (Time, Place, and Person)  Thought Content:  Ruminations about his life and relationship with his mom. No delusional theme. No preoccupation with violent thoughts. No obsession.  No hallucination in any modality.   Suicidal Thoughts:  Had thoughts at home. Denies any intent. Denies any current plans of suicide. wants to get better and go back to work.   Homicidal Thoughts:  No  Memory:  Immediate;   Good Recent;   Good Remote;   Good  Judgement:  Fair  Insight:  Good  Psychomotor Activity:  Decreased  Concentration:  Concentration: Fair and Attention Span: Fair  Recall:  Good  Fund of Knowledge:  Good  Language:  Good  Akathisia:  No  Handed:    AIMS (if indicated):     Assets:  Desire for Improvement Financial Resources/Insurance Housing Physical Health Talents/Skills Vocational/Educational  ADL's:  Impaired  Cognition:  WNL  Sleep:  Number of Hours: 6    Treatment Plan Summary: Patient has a premorbid avoidant personality style. He is presenting with unipolar depression. This is precipitated by poor relationship with is mom. It is perpetuated  by losses he has suffered over the years ( dad, girlfriend, pregnancies). He is currently overwhelmed by his retest that is coming up. 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 Patient seems motivated to get better. He has consented to use Sertraline again as it helped in the past.    Psychiatric: MDD recurrent  Medical:  Psychosocial:  Poor interpersonal relationships Divorced  PLAN: 1. Sertraline 25 mg daily. Would titrate as tolerated.  2. Encourage unit groups and activities 3. Monitor mood, behavior and interaction with peers 4. SW would obtain collateral from his family 5. SW would facilitate aftercare.    Observation Level/Precautions:  15 minute checks  Laboratory:    Psychotherapy:    Medications:    Consultations:    Discharge Concerns:    Estimated LOS:  Other:     Physician Treatment Plan for Primary Diagnosis: <principal problem not specified> Long Term Goal(s): Improvement in symptoms so as ready for discharge  Short Term Goals: Ability to identify changes in lifestyle to reduce recurrence of condition will improve, Ability to verbalize feelings will improve, Ability to disclose  and discuss suicidal ideas, Ability to demonstrate self-control will improve, Ability to identify and develop effective coping behaviors will improve, Ability to maintain clinical measurements within normal limits will improve and Compliance with prescribed medications will improve  Physician Treatment Plan for Secondary Diagnosis: Active Problems:   Major depressive disorder, recurrent severe without psychotic features ([place])  Long Term Goal(s): Improvement in symptoms so as ready for discharge  Short Term Goals: Ability to identify changes in lifestyle to reduce recurrence of condition will improve, Ability to verbalize feelings will improve, Ability to disclose and discuss suicidal ideas, Ability to demonstrate self-control will improve, Ability to identify and  develop effective coping behaviors will improve, Ability to maintain clinical measurements within normal limits will improve and Compliance with prescribed medications will improve  I certify that inpatient services furnished can reasonably be expected to improve the patient's condition.    [place], MD 4/24/20189:54 AM

## 2017-03-10 DIAGNOSIS — F332 Major depressive disorder, recurrent severe without psychotic features: Principal | ICD-10-CM

## 2017-03-10 DIAGNOSIS — Z818 Family history of other mental and behavioral disorders: Secondary | ICD-10-CM

## 2017-03-10 NOTE — [facility] Group Notes (Signed)
[facility] Group Notes:  (Counselor/Nursing/MHT/Case Management/Adjunct)  03/10/2017 1:15PM  Type of Therapy:  Group Therapy  Participation Level:  Active  Participation Quality:  Appropriate  Affect:  Flat  Cognitive:  Oriented  Insight:  Improving  Engagement in Group:  Limited  Engagement in Therapy:  Limited  Modes of Intervention:  Discussion, Exploration and Socialization  Summary of Progress/Problems: The topic for group was balance in life.  Pt participated in the discussion about when their life was in balance and out of balance and how this feels.  Pt discussed ways to get back in balance and short term goals they can work on to get where they want to be. Stayed the entire time, engaged throughout.  Flat affect.  Lots of yawning and stating that he is tired, "but I always feel this way."  "I'm balanced today, and I'm motivated to leave here."  Identified being with his dogs and his fathers cars as things that help him find balance.   Daryel Gerald B 03/10/2017 4:09 PM

## 2017-03-10 NOTE — Progress Notes (Signed)
Recreation Therapy Notes  Date: 03/10/17 Time: 1000 Location: [address] Dayroom  Group Topic: Self-Esteem  Goal Area(s) Addresses:  Patient will identify positive ways to increase self-esteem. Patient will verbalize benefit of increased self-esteem.  Intervention: Blank picture frame worksheet, colored pencils, markers  Activity: What Makes Me, Me.  Patients were given a blank sheet of paper outlined with a picture frame.  Patients were to highlight their positive traits through either a poem, rap or picture.  Education:  Self-Esteem, Building control surveyor.   Education Outcome: Acknowledges education/In group clarification offered/Needs additional education  Clinical Observations/Feedback: Pt did not attend group.    Caroll Rancher, LRT/CTRS         Caroll Rancher A 03/10/2017 12:22 PM

## 2017-03-10 NOTE — Progress Notes (Signed)
D: Patient denies SI/HI and A/V hallucinations; patient reports that he slept well   A: Monitored q 15 minutes; patient encouraged to attend groups; patient educated about medications; patient given medications per physician orders; patient encouraged to express feelings and/or concerns  R: Patient is cooperative and appropriate to circumstances; patient's interaction with staff and peers is appropriate; patient was able to set goal to talk with staff 1:1 when having feelings of SI; patient is taking medications as prescribed and tolerating medications; patient is attending all groups

## 2017-03-10 NOTE — Progress Notes (Signed)
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 Presented to the ER in company of his friend. Reports suicidal thoughts. Has thoughts of shooting himself. Patient has access to multiple weapons. Patient reports main stressor as recent break-up with his girlfriend and poor relationship with his mom. He reports worsening symptoms of depression. Has guilty ruminations, has lost confidence in himself and feels he is a disappointment to everyone. Significantly, patients father committed suicide in August 2016. UDS, BAL and routine labs are all within normal limits.   Chart reviewed today. Patient discussed at team.  Staff reports that he has been tolerating his medication well. He has been participating at unit groups and activities. He is a bit shy but warms up gradually. He enjoyed gymn yesterday. He reports feeling better and hopes to be home soon. No suicidal thoughts. Slept well last night.  SW gathered collateral from his mom. Mom describes patient as being dependent. Mom has secured all his guns.   Seen today. Tolerating his medication well. Says he slept well last night. He is in better spirits. Says his mom and a friend of his visited him yesterday. Very excited about that. Patient talked about his family. He talked about what he thinks led his father to commit suicide. Says he is not suicidal. Feels life is treating him well. He loves his job. Says he hopes to inherit the company someday from his mom. No thoughts of violence. No homicidal thought.   Principal Problem: Major depressive disorder, recurrent severe without psychotic features ([place]) Diagnosis:   Patient Active Problem List   Diagnosis Date Noted  . Major depressive disorder, recurrent severe without psychotic features ([facility]) [F33.2] 03/08/2017  . Myoclonus [G25.3] 09/02/2015   Total Time spent with patient: 30  minutes  Past Psychiatric History: As in H&P   Past Medical History:  Past Medical History:  Diagnosis Date  . Depression    History reviewed. No pertinent surgical history. Family History:  Family History  Problem Relation Age of Onset  . Heart attack Father   . Stroke Father   . Seizures Maternal Uncle   . Anxiety disorder Maternal Grandmother    Family Psychiatric  History: As in H&P  Social History:  History  Alcohol Use No     History  Drug Use No    Social History   Social History  . Marital status: Single    Spouse name: N/A  . Number of children: 0  . Years of education: HS   Occupational History  . [employer]    Social History Main Topics  . Smoking status: Never Smoker  . Smokeless tobacco: Never Used  . Alcohol use No  . Drug use: No  . Sexual activity: Not Currently   Other Topics Concern  . None   Social History Narrative   Patient drinks about 2-3 sodas daily.   Patient is right handed.    Additional Social History:    Pain Medications: none Prescriptions: none Over the Counter: none History of alcohol / drug use?: No history of alcohol / drug abuse    Sleep: Good  Appetite:  Good  Current Medications: Current Facility-Administered Medications  Medication Dose Route Frequency Provider Last Rate Last Dose  . acetaminophen (TYLENOL) tablet 650 mg  650 mg Oral Q6H PRN Patrecia Pour, NP      . alum &  mag hydroxide-simeth (MAALOX/MYLANTA) 200-200-20 MG/5ML suspension 30 mL  30 mL Oral PRN Patrecia Pour, NP      . ibuprofen (ADVIL,MOTRIN) tablet 600 mg  600 mg Oral Q8H PRN Patrecia Pour, NP      . magnesium hydroxide (MILK OF MAGNESIA) suspension 30 mL  30 mL Oral Daily PRN Patrecia Pour, NP      . ondansetron (ZOFRAN-ODT) disintegrating tablet 4 mg  4 mg Oral Q8H PRN Patrecia Pour, NP      . sertraline (ZOLOFT) tablet 50 mg  50 mg Oral Daily Artist Beach, MD   50 mg at 03/10/17 0800  . traZODone (DESYREL) tablet 50 mg   50 mg Oral QHS PRN Artist Beach, MD        Lab Results:  Results for orders placed or performed during the hospital encounter of 03/08/17 (from the past 48 hour(s))  Comprehensive metabolic panel     Status: Abnormal   Collection Time: 03/08/17  4:36 PM  Result Value Ref Range   Sodium 137 135 - 145 mmol/L   Potassium 3.9 3.5 - 5.1 mmol/L   Chloride 104 101 - 111 mmol/L   CO2 25 22 - 32 mmol/L   Glucose, Bld 109 (H) 65 - 99 mg/dL   BUN 14 6 - 20 mg/dL   Creatinine, Ser 0.87 0.61 - 1.24 mg/dL   Calcium 9.2 8.9 - 10.3 mg/dL   Total Protein 7.9 6.5 - 8.1 g/dL   Albumin 4.8 3.5 - 5.0 g/dL   AST 34 15 - 41 U/L   ALT 57 17 - 63 U/L   Alkaline Phosphatase 73 38 - 126 U/L   Total Bilirubin 1.0 0.3 - 1.2 mg/dL   GFR calc non Af Amer >60 >60 mL/min   GFR calc Af Amer >60 >60 mL/min    Comment: (NOTE) The eGFR has been calculated using the CKD EPI equation. This calculation has not been validated in all clinical situations. eGFR's persistently <60 mL/min signify possible Chronic Kidney Disease.    Anion gap 8 5 - 15  Ethanol     Status: None   Collection Time: 03/08/17  4:36 PM  Result Value Ref Range   Alcohol, Ethyl (B) <5 <5 mg/dL    Comment:        LOWEST DETECTABLE LIMIT FOR SERUM ALCOHOL IS 5 mg/dL FOR MEDICAL PURPOSES ONLY   Salicylate level     Status: None   Collection Time: 03/08/17  4:36 PM  Result Value Ref Range   Salicylate Lvl <2.0 2.8 - 30.0 mg/dL  Acetaminophen level     Status: Abnormal   Collection Time: 03/08/17  4:36 PM  Result Value Ref Range   Acetaminophen (Tylenol), Serum <10 (L) 10 - 30 ug/mL    Comment:        THERAPEUTIC CONCENTRATIONS VARY SIGNIFICANTLY. A RANGE OF 10-30 ug/mL MAY BE AN EFFECTIVE CONCENTRATION FOR MANY PATIENTS. HOWEVER, SOME ARE BEST TREATED AT CONCENTRATIONS OUTSIDE THIS RANGE. ACETAMINOPHEN CONCENTRATIONS >150 ug/mL AT 4 HOURS AFTER INGESTION AND >50 ug/mL AT 12 HOURS AFTER INGESTION ARE OFTEN ASSOCIATED WITH  TOXIC REACTIONS.   cbc     Status: None   Collection Time: 03/08/17  4:36 PM  Result Value Ref Range   WBC 10.2 4.0 - 10.5 K/uL   RBC 5.15 4.22 - 5.81 MIL/uL   Hemoglobin 15.7 13.0 - 17.0 g/dL   HCT 44.1 39.0 - 52.0 %   MCV 85.6 78.0 - 100.0  fL   MCH 30.5 26.0 - 34.0 pg   MCHC 35.6 30.0 - 36.0 g/dL   RDW 12.5 11.5 - 15.5 %   Platelets 266 150 - 400 K/uL  Rapid urine drug screen (hospital performed)     Status: None   Collection Time: 03/08/17  4:36 PM  Result Value Ref Range   Opiates NONE DETECTED NONE DETECTED   Cocaine NONE DETECTED NONE DETECTED   Benzodiazepines NONE DETECTED NONE DETECTED   Amphetamines NONE DETECTED NONE DETECTED   Tetrahydrocannabinol NONE DETECTED NONE DETECTED   Barbiturates NONE DETECTED NONE DETECTED    Comment:        DRUG SCREEN FOR MEDICAL PURPOSES ONLY.  IF CONFIRMATION IS NEEDED FOR ANY PURPOSE, NOTIFY LAB WITHIN 5 DAYS.        LOWEST DETECTABLE LIMITS FOR URINE DRUG SCREEN Drug Class       Cutoff (ng/mL) Amphetamine      1000 Barbiturate      200 Benzodiazepine   174 Tricyclics       081 Opiates          300 Cocaine          300 THC              50     Blood Alcohol level:  Lab Results  Component Value Date   ETH <5 44/81/8563    Metabolic Disorder Labs: No results found for: HGBA1C, MPG No results found for: PROLACTIN No results found for: CHOL, TRIG, HDL, CHOLHDL, VLDL, LDLCALC  Physical Findings: AIMS: Facial and Oral Movements Muscles of Facial Expression: None, normal Lips and Perioral Area: None, normal Jaw: None, normal Tongue: None, normal,Extremity Movements Upper (arms, wrists, hands, fingers): None, normal Lower (legs, knees, ankles, toes): None, normal, Trunk Movements Neck, shoulders, hips: None, normal, Overall Severity Severity of abnormal movements (highest score from questions above): None, normal Incapacitation due to abnormal movements: None, normal Patient's awareness of abnormal movements (rate  only patient's report): No Awareness, Dental Status Current problems with teeth and/or dentures?: No Does patient usually wear dentures?: No  CIWA:    COWS:     Musculoskeletal: Strength & Muscle Tone: within normal limits Gait & Station: normal Patient leans: N/A  Psychiatric Specialty Exam: Physical Exam  Constitutional: He is oriented to person, place, and time. He appears well-developed and well-nourished.  HENT:  Head: Normocephalic and atraumatic.  Eyes: Conjunctivae are normal. Pupils are equal, round, and reactive to light.  Neck: Normal range of motion. Neck supple.  Cardiovascular: Normal rate and regular rhythm.   Respiratory: Effort normal and breath sounds normal.  GI: Soft. Bowel sounds are normal.  Musculoskeletal: Normal range of motion.  Neurological: He is alert and oriented to person, place, and time.  Skin: Skin is warm and dry.  Psychiatric:  As above    ROS  Blood pressure 116/73, pulse 93, temperature 98.4 F (36.9 C), temperature source Oral, resp. rate 20, height '5\' 7"'  (1.702 m), weight 71.2 kg (157 lb).Body mass index is 24.59 kg/m.  Appearance and Behavior: Neatly dressed, calm and cooperative. Loves to talk about his family. Not in any distress.   Eye Contact:  Fair  Speech:  Clear and Coherent and Normal Rate  Volume:  Normal  Mood:  Feeling better  Affect:  Full range and appropriate.  Thought Process:  Goal Directed and Linear  Orientation:  Full (Time, Place, and Person)  Thought Content:  Future oriented. No delusional theme. No preoccupation with  violent thoughts. No negative ruminations. No obsession.  No hallucination in any modality.   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 Increase Sertraline to 50 mg daily 2. Continue to monitor mood, behavior and interaction with peers. 3. Hopeful discharge on Saturday if he maintains stability   Artist Beach, MD 03/10/2017, 9:57 AM

## 2017-03-10 NOTE — Progress Notes (Signed)
Nursing Progress Note: 7p-7a D: Pt currently presents with a pleasant affect and behavior. Pt states "I'm just tired. it's been a long day for me, but I feel fine." Not interacting with milieu. Pt reports good sleep with current medication regimen.   A: Pt provided with medications per providers orders. Pt's labs and vitals were monitored throughout the night. Pt supported emotionally and encouraged to express concerns and questions. Pt educated on medications.  R: Pt's safety ensured with 15 minute and environmental checks. Pt currently denies SI/HI/Self Harm and AVH. Pt verbally contracts to seek staff if SI/HI or A/VH occurs and to consult with staff before acting on any harmful thoughts. Will continue to monitor.

## 2017-03-10 NOTE — [facility] Group Notes (Signed)
[facility] LCSW Aftercare Discharge Planning Group Note  03/10/2017 8:45 AM  Participation Quality: Alert, Appropriate and Oriented  Mood/Affect: "Fine"; blunted  Depression Rating: 5  Anxiety Rating: 5  Plan for Discharge/Comments: Pt attended discharge planning group and actively participated in group. CSW discussed suicide prevention education with the group and encouraged them to discuss discharge planning and any relevant barriers. Pt reports feeling that he is at his baseline with his mood. Pt is focused on discharge and reports wanting to speak with the MD about his discharge plan.  Vernie Shanks, LCSW 03/10/2017 9:47 AM

## 2017-03-11 NOTE — Progress Notes (Signed)
DAR NOTE: Pt present with flat affect and depressed mood in the unit. Pt has been visible in the milieu interacting with peers. Pt denies physical pain, took all his meds as scheduled. As per self inventory, pt had a fair night sleep, fair appetite, low energy, and poor concentration. Pt rate depression at 3, hopeless ness at 3, and anxiety at 4. Pt's safety ensured with 15 minute and environmental checks. Pt currently denies SI/HI and A/V hallucinations. Pt verbally agrees to seek staff if SI/HI or A/VH occurs and to consult with staff before acting on these thoughts. Will continue POC.

## 2017-03-11 NOTE — [facility] Group Notes (Signed)
[facility] LCSW Group Therapy  03/11/2017 1:57 PM  Type of Therapy:  Group Therapy  Participation Level:  Active  Participation Quality:  Attentive  Affect:  Appropriate  Cognitive:  Alert and Oriented  Insight:  Improving  Engagement in Therapy:  Improving  Modes of Intervention:  Discussion, Education, Exploration, Socialization and Support  Summary of Progress/Problems: MHA Speaker came to talk about his personal journey with substance abuse and addiction. The pt processed ways by which to relate to the speaker. MHA speaker provided handouts and educational information pertaining to groups and services offered by the [facility].   Zimri Brennen N Smart LCSW 03/11/2017, 1:57 PM

## 2017-03-11 NOTE — Progress Notes (Signed)
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 Presented to the ER in company of his friend. Reports suicidal thoughts. Has thoughts of shooting himself. Patient has access to multiple weapons. Patient reports main stressor as recent break-up with his girlfriend and poor relationship with his mom. He reports worsening symptoms of depression. Has guilty ruminations, has lost confidence in himself and feels he is a disappointment to everyone. Significantly, patients father committed suicide in August 2016. UDS, BAL and routine labs are all within normal limits.   Chart reviewed today. Patient discussed at team.  Staff reports that he has been doing well. No behavioral issues. No suicidal thoughts. He has been maintaining normal biological functions.   Seen today. In good spirits. Says his friends are going to a car show in [place]. He hopes to attend with them. Not feeling depressed anymore. Reports normal energy. His family has been supportive. Says another family member is coming today.   Principal Problem: Major depressive disorder, recurrent severe without psychotic features ([facility]) Diagnosis:   Patient Active Problem List   Diagnosis Date Noted  . Major depressive disorder, recurrent severe without psychotic features ([facility]) [F33.2] 03/08/2017  . Myoclonus [G25.3] 09/02/2015   Total Time spent with patient: 30 minutes  Past Psychiatric History: As in H&P   Past Medical History:  Past Medical History:  Diagnosis Date  . Depression    History reviewed. No pertinent surgical history. Family History:  Family History  Problem Relation Age of Onset  . Heart attack Father   . Stroke Father   . Seizures Maternal Uncle   . Anxiety disorder Maternal Grandmother    Family Psychiatric  History: As in H&P  Social History:  History  Alcohol Use No     History  Drug Use No     Social History   Social History  . Marital status: Single    Spouse name: N/A  . Number of children: 0  . Years of education: HS   Occupational History  . [employer]    Social History Main Topics  . Smoking status: Never Smoker  . Smokeless tobacco: Never Used  . Alcohol use No  . Drug use: No  . Sexual activity: Not Currently   Other Topics Concern  . None   Social History Narrative   Patient drinks about 2-3 sodas daily.   Patient is right handed.    Additional Social History:    Pain Medications: none Prescriptions: none Over the Counter: none History of alcohol / drug use?: No history of alcohol / drug abuse    Sleep: Good  Appetite:  Good  Current Medications: Current Facility-Administered Medications  Medication Dose Route Frequency Provider Last Rate Last Dose  . acetaminophen (TYLENOL) tablet 650 mg  650 mg Oral Q6H PRN Charm Rings, NP   650 mg at 03/10/17 1201  . alum & mag hydroxide-simeth (MAALOX/MYLANTA) 200-200-20 MG/5ML suspension 30 mL  30 mL Oral PRN Charm Rings, NP      . ibuprofen (ADVIL,MOTRIN) tablet 600 mg  600 mg Oral Q8H PRN Charm Rings, NP      . magnesium hydroxide (MILK OF MAGNESIA) suspension 30 mL  30 mL Oral Daily PRN Charm Rings, NP      . ondansetron (ZOFRAN-ODT) disintegrating tablet 4 mg  4 mg Oral Q8H PRN Charm Rings, NP      .  sertraline (ZOLOFT) tablet 50 mg  50 mg Oral Daily Georgiann Cocker, MD   50 mg at 03/11/17 0750  . traZODone (DESYREL) tablet 50 mg  50 mg Oral QHS PRN Georgiann Cocker, MD        Lab Results:  No results found for this or any previous visit (from the past 48 hour(s)).  Blood Alcohol level:  Lab Results  Component Value Date   ETH <5 03/08/2017    Metabolic Disorder Labs: No results found for: HGBA1C, MPG No results found for: PROLACTIN No results found for: CHOL, TRIG, HDL, CHOLHDL, VLDL, LDLCALC  Physical Findings: AIMS: Facial and Oral Movements Muscles of Facial  Expression: None, normal Lips and Perioral Area: None, normal Jaw: None, normal Tongue: None, normal,Extremity Movements Upper (arms, wrists, hands, fingers): None, normal Lower (legs, knees, ankles, toes): None, normal, Trunk Movements Neck, shoulders, hips: None, normal, Overall Severity Severity of abnormal movements (highest score from questions above): None, normal Incapacitation due to abnormal movements: None, normal Patient's awareness of abnormal movements (rate only patient's report): No Awareness, Dental Status Current problems with teeth and/or dentures?: No Does patient usually wear dentures?: No  CIWA:    COWS:     Musculoskeletal: Strength & Muscle Tone: within normal limits Gait & Station: normal Patient leans: N/A  Psychiatric Specialty Exam: Physical Exam  Constitutional: He is oriented to person, place, and time. He appears well-developed and well-nourished.  HENT:  Head: Normocephalic and atraumatic.  Eyes: Conjunctivae are normal. Pupils are equal, round, and reactive to light.  Neck: Normal range of motion. Neck supple.  Cardiovascular: Normal rate and regular rhythm.   Respiratory: Effort normal and breath sounds normal.  GI: Soft. Bowel sounds are normal.  Musculoskeletal: Normal range of motion.  Neurological: He is alert and oriented to person, place, and time.  Skin: Skin is warm and dry.  Psychiatric:  As above    ROS  Blood pressure 108/76, pulse 91, temperature 98.5 F (36.9 C), temperature source Oral, resp. rate 18, height  (1.702 m), weight 71.2 kg (157 lb).Body mass index is 24.59 kg/m.  Appearance and Behavior: Neatly dressed, pleasant, engaging well and cooperative. Appropriate behavior. Not in any distress. Good relatedness. Not internally stimulated  Eye Contact:  Good  Speech:  Clear and Coherent and Normal Rate  Volume:  Normal  Mood:  Euthymic  Affect:  Full range and appropriate.  Thought Process:  Goal Directed and Linear   Orientation:  Full (Time, Place, and Person)  Thought Content:  Future oriented. No delusional theme. No preoccupation with violent thoughts. No negative ruminations. No obsession.  No hallucination in any modality.   Suicidal Thoughts:  No  Homicidal Thoughts:  No  Memory:  Immediate;   Good Recent;   Good Remote;   Good  Judgement:  Good  Insight:  Good  Psychomotor Activity:  Normal  Concentration:  Concentration: Good and Attention Span: Good  Recall:  Good  Fund of Knowledge:  Good  Language:  Good  Akathisia:  No  Handed:    AIMS (if indicated):     Assets:  Communication Skills Desire for Improvement Financial Resources/Insurance Housing Physical Health Vocational/Educational  ADL's:  Intact  Cognition:  WNL  Sleep:  Number of Hours: 6.75     Treatment Plan Summary: Patient is responding well to treatment. He is no longer depressed. No suicidal thoughts. He plans to go out with friends on Saturday. For discharge tomorrow.   Psychiatric: MDD recurrent  Avoidant and Dependent personality trait  Medical:  Psychosocial:  Poor interpersonal relationships Divorced  PLAN: 1. Continue current regimen 2. Continue to monitor mood, behavior and interaction with peers. 3. Home tomorrow.   Georgiann Cocker, MD 03/11/2017, 6:06 PMPatient ID: Lauris Poag, male   DOB: 1981-06-07, 36 y.o.   MRN: [id number]

## 2017-03-11 NOTE — Progress Notes (Addendum)
  [facility] Adult Case Management Discharge Plan :  Will you be returning to the same living situation after discharge:  Yes,  home At discharge, do you have transportation home?: Yes,  family member/brother-discharge scheduled for Friday, 03/12/17 Do you have the ability to pay for your medications: Yes,  [employer] private insurance  Release of information consent forms completed and submitted to medical records by CSW.  Patient to Follow up at: Follow-up Information    [facility] Follow up on 03/25/2017.   Specialty:  Behavioral Health Why:  Assessment with individual/family counselor on Mar 25, 2017 at 2:00PM with Danice Goltz. She will schedule you for medication management appt.Please arrive by 1:45PM to complete new patient paperwork and bring insurance card to this appt. Thank you!  Contact information: [address] [place] [address] [phone]  [facility]-if patient wants to be seen prior to his first appt with [facility], he is welcome to walk in between 8am-9am Monday through Friday at this agency for hospital follow-up.          Next level of care provider has access to [facility] Link:no  Safety Planning and Suicide Prevention discussed: Yes,  SPE completed with pt's mother.  Have you used any form of tobacco in the last 30 days? (Cigarettes, Smokeless Tobacco, Cigars, and/or Pipes): No  Has patient been referred to the Quitline?: N/A patient is not a smoker  Patient has been referred for addiction treatment: not applicable, as pt does not have a substance abuse diagnosis.    Lourdez Mcgahan N Smart LCSW 03/11/2017, 3:04 PM

## 2017-03-11 NOTE — Tx Team (Signed)
Interdisciplinary Treatment and Diagnostic Plan Update  03/11/2017 Time of Session: 0930 Ralph Barajas MRN: [id number]  Principal Diagnosis:  MDD  Secondary Diagnoses: Principal Problem:   Major depressive disorder, recurrent severe without psychotic features ([place])   Current Medications:  Current Facility-Administered Medications  Medication Dose Route Frequency Provider Last Rate Last Dose  . acetaminophen (TYLENOL) tablet 650 mg  650 mg Oral Q6H PRN Patrecia Pour, NP   650 mg at 03/10/17 1201  . alum & mag hydroxide-simeth (MAALOX/MYLANTA) 200-200-20 MG/5ML suspension 30 mL  30 mL Oral PRN Patrecia Pour, NP      . ibuprofen (ADVIL,MOTRIN) tablet 600 mg  600 mg Oral Q8H PRN Patrecia Pour, NP      . magnesium hydroxide (MILK OF MAGNESIA) suspension 30 mL  30 mL Oral Daily PRN Patrecia Pour, NP      . ondansetron (ZOFRAN-ODT) disintegrating tablet 4 mg  4 mg Oral Q8H PRN Patrecia Pour, NP      . sertraline (ZOLOFT) tablet 50 mg  50 mg Oral Daily Artist Beach, MD   50 mg at 03/11/17 0750  . traZODone (DESYREL) tablet 50 mg  50 mg Oral QHS PRN Artist Beach, MD       PTA Medications: Prescriptions Prior to Admission  Medication Sig Dispense Refill Last Dose  . ibuprofen (ADVIL,MOTRIN) 200 MG tablet Take 600 mg by mouth every 6 (six) hours as needed for headache.   03/07/2017    Patient Stressors: Marital or family conflict Occupational concerns  Patient Strengths: Capable of independent living Occupational psychologist fund of knowledge Work skills  Treatment Modalities: Medication Management, Group therapy, Case management,  1 to 1 session with clinician, Psychoeducation, Recreational therapy.   Physician Treatment Plan for Primary Diagnosis: MDD  Medication Management: Evaluate patient's response, side effects, and tolerance of medication regimen.  Therapeutic Interventions: 1 to 1 sessions, Unit Group sessions and Medication  administration.  Evaluation of Outcomes: Met  Physician Treatment Plan for Secondary Diagnosis: Principal Problem:   Major depressive disorder, recurrent severe without psychotic features ([place])  Medication Management: Evaluate patient's response, side effects, and tolerance of medication regimen.  Therapeutic Interventions: 1 to 1 sessions, Unit Group sessions and Medication administration.  Evaluation of Outcomes: Met  RN Treatment Plan for Primary Diagnosis: MDD Long Term Goal(s): Knowledge of disease and therapeutic regimen to maintain health will improve  Short Term Goals: Ability to remain free from injury will improve, Ability to verbalize feelings will improve and Ability to disclose and discuss suicidal ideas  Medication Management: RN will administer medications as ordered by provider, will assess and evaluate patient's response and provide education to patient for prescribed medication. RN will report any adverse and/or side effects to prescribing provider.  Therapeutic Interventions: 1 on 1 counseling sessions, Psychoeducation, Medication administration, Evaluate responses to treatment, Monitor vital signs and CBGs as ordered, Perform/monitor CIWA, COWS, AIMS and Fall Risk screenings as ordered, Perform wound care treatments as ordered.  Evaluation of Outcomes: Met   LCSW Treatment Plan for Primary Diagnosis: MDD Long Term Goal(s): Safe transition to appropriate next level of care at discharge, Engage patient in therapeutic group addressing interpersonal concerns.  Short Term Goals: Engage patient in aftercare planning with referrals and resources, Facilitate patient progression through stages of change regarding substance use diagnoses and concerns and Identify triggers associated with mental health/substance abuse issues  Therapeutic Interventions: Assess for all discharge needs, 1 to 1 time with Education officer, museum,  Explore available resources and support systems, Assess for  adequacy in community support network, Educate family and significant other(s) on suicide prevention, Complete Psychosocial Assessment, Interpersonal group therapy.  Evaluation of Outcomes: Met  Progress in Treatment: Attending groups: Yes Participating in groups: Yes Taking medication as prescribed: Yes. Toleration medication: Yes. Family/Significant other contact made: SPE completed with pt's mother.  Patient understands diagnosis: Yes. Discussing patient identified problems/goals with staff: Yes. Medical problems stabilized or resolved: Yes. Denies suicidal/homicidal ideation: Yes. Issues/concerns per patient self-inventory: No. Other: n/a   New problem(s) identified: Yes, Describe:  Pt reports plan to shoot self prior to admission; collects and owns several guns in home. CSW to follow-up.  New Short Term/Long Term Goal(s): medication stabilization; elimination of SI thoughts; development of comprehensive mental wellness/sobriety plan.   Discharge Plan or Barriers: Pt referred to [facility] for medication management, 1:1 counseling, and family therapy. Appt made. Pt has appt May 10 and does not want additional referral for appt sooner.   Reason for Continuation of Hospitalization: medication management.   Estimated Length of Stay: 1 day (discharge scheduled for Friday).    Attendees: Patient: 03/11/2017 3:16 PM  Physician: Dr. Sanjuana Letters MD 03/11/2017 3:16 PM  Nursing: Junie Bame RN 03/11/2017 3:16 PM  RN Care Manager: Lars Pinks CM 03/11/2017 3:16 PM  Social Worker: Maxie Better, LCSW 03/11/2017 3:16 PM  Recreational Therapist: Rhunette Croft 03/11/2017 3:16 PM  Other:  Lindell Spar NP; May Augustin NP 03/11/2017 3:16 PM  Other:  03/11/2017 3:16 PM  Other: 03/11/2017 3:16 PM    Scribe for Treatment Team: Kimber Relic Smart, LCSW 03/11/2017 3:16 PM

## 2017-03-11 NOTE — Plan of Care (Signed)
Problem: Activity: Goal: Interest or engagement in leisure activities will improve Outcome: Progressing Pt attending groups and actively participating

## 2017-03-12 MED ORDER — TRAZODONE HCL 50 MG PO TABS: 50.0000 mg | ORAL_TABLET | Freq: Every evening | ORAL | 0 refills | Status: AC | PRN

## 2017-03-12 MED ORDER — SERTRALINE HCL 50 MG PO TABS: 50.0000 mg | ORAL_TABLET | Freq: Every day | ORAL | 0 refills | Status: AC

## 2017-03-12 MED ORDER — IBUPROFEN 200 MG PO TABS: 600.0000 mg | ORAL_TABLET | Freq: Four times a day (QID) | ORAL | 0 refills | Status: DC | PRN

## 2017-03-12 NOTE — Discharge Summary (Signed)
Physician Discharge Summary Note  Patient:  Ralph Barajas is an 36 y.o., male MRN:  [id number] DOB:  1981/08/19 Patient phone:  [phone] (home)  Patient address:   [phone] [place] [address],  Total Time spent with patient: Greater than 30 minutes  Date of Admission:  03/08/2017 Date of Discharge: 03-12-17  Reason for Admission: Suicidal ideations with plans to shoot himself.  Principal Problem: Major depressive disorder, recurrent severe without psychotic features [facility])  Discharge Diagnoses: Patient Active Problem List   Diagnosis Date Noted  . Major depressive disorder, recurrent severe without psychotic features ([facility]) [F33.2] 03/08/2017  . Myoclonus [G25.3] 09/02/2015   Past Psychiatric History: Major depressive disorder, recurrent  Past Medical History:  Past Medical History:  Diagnosis Date  . Depression    History reviewed. No pertinent surgical history. Family History:  Family History  Problem Relation Age of Onset  . Heart attack Father   . Stroke Father   . Seizures Maternal Uncle   . Anxiety disorder Maternal Grandmother    Family Psychiatric  History: See H&P Social History:  History  Alcohol Use No     History  Drug Use No    Social History   Social History  . Marital status: Single    Spouse name: N/A  . Number of children: 0  . Years of education: HS   Occupational History  . [employer]    Social History Main Topics  . Smoking status: Never Smoker  . Smokeless tobacco: Never Used  . Alcohol use No  . Drug use: No  . Sexual activity: Not Currently   Other Topics Concern  . None   Social History Narrative   Patient drinks about 2-3 sodas daily.   Patient is right handed.    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 Reports suicidal thoughts. Has thoughts of shooting himself. Patient has access to multiple  weapons. Patient reports main stressor as a recent break-up with his girlfriend and poor relationship with his mom. He reports worsening symptoms of depression. Has guilty ruminations, has lost confidence in himself and feels he is a disappointment to everyone. Significantly, patients father committed suicide in August 2016. No past history of suicidal behavior. No past psychiatric hospitalization. No history of trauma.   After evaluation of his presenting symptoms, Keishon was medicated & discharged on; Sertraline 50 mg for depression & Trazodone 50 mg for insomnia. He was also enrolled & participated in the group counseling sessions being offered & held on this unit. He learned coping skills. He presented no other significant health issues that required treatment & or monitoring. He tolerated his treatment regimen without any adverse effects or reactions reported.  Armaan is seen today. He presents in good spirits. He reported that his mood has lifted. No longer having suicidal thoughts. Has much better self esteem. Hopeful about the future. Pleased with the support he has been getting from his family. Has been sleeping well at night. Reports normal energy and appetite. No evidence of mania. No evidence of psychosis. No overwhelming anxiety. No thoughts of homicide. No thoughts of violence.   Nursing staff reports that patient has been appropriate on the unit. Patient has been interacting well with peers. No behavioral issues. Patient has not voiced any suicidal thoughts. Patient has not been observed to be internally stimulated. Patient has been adherent with treatment recommendations. Patient has  been tolerating their medication well.   Welby was discussed at the treatment team meeting this morning. The treatment Team members feel that patient is back to his baseline level of function. Team agrees with plan to discharge patient today. Clemente is being discharged to follow-up care on an outpatient basis  as noted below. He [facility] with all personal belongings in no apparent distress. Transportation per family.  Physical Findings: AIMS: Facial and Oral Movements Muscles of Facial Expression: None, normal Lips and Perioral Area: None, normal Jaw: None, normal Tongue: None, normal,Extremity Movements Upper (arms, wrists, hands, fingers): None, normal Lower (legs, knees, ankles, toes): None, normal, Trunk Movements Neck, shoulders, hips: None, normal, Overall Severity Severity of abnormal movements (highest score from questions above): None, normal Incapacitation due to abnormal movements: None, normal Patient's awareness of abnormal movements (rate only patient's report): No Awareness, Dental Status Current problems with teeth and/or dentures?: No Does patient usually wear dentures?: No  CIWA:    COWS:     Musculoskeletal: Strength & Muscle Tone: within normal limits Gait & Station: normal Patient leans: N/A  Psychiatric Specialty Exam: Physical Exam  Constitutional: He appears well-developed.  HENT:  Head: Normocephalic.  Eyes: Pupils are equal, round, and reactive to light.  Neck: Normal range of motion.  Cardiovascular: Normal rate.   Respiratory: Effort normal.  GI: Soft.  Genitourinary:  Genitourinary Comments: Deferred  Musculoskeletal: Normal range of motion.  Neurological: He is alert.  Skin: Skin is warm.    Review of Systems  Constitutional: Negative.   HENT: Negative.   Eyes: Negative.   Respiratory: Negative.   Cardiovascular: Negative.   Gastrointestinal: Negative.   Genitourinary: Negative.   Musculoskeletal: Negative.   Skin: Negative.   Neurological: Negative.   Endo/Heme/Allergies: Negative.   Psychiatric/Behavioral: Positive for depression (Stable). Negative for hallucinations, memory loss, substance abuse and suicidal ideas. The patient has insomnia (Stable). The patient is not nervous/anxious.     Blood pressure 126/78, pulse 93, temperature  98.2 F (36.8 C), resp. rate 18, height  (1.702 m), weight 71.2 kg (157 lb).Body mass index is 24.59 kg/m.  See Md's SRA   Have you used any form of tobacco in the last 30 days? (Cigarettes, Smokeless Tobacco, Cigars, and/or Pipes): No  Has this patient used any form of tobacco in the last 30 days? (Cigarettes, Smokeless Tobacco, Cigars, and/or Pipes) No  Blood Alcohol level:  Lab Results  Component Value Date   ETH <5 03/08/2017   Metabolic Disorder Labs:  No results found for: HGBA1C, MPG No results found for: PROLACTIN No results found for: CHOL, TRIG, HDL, CHOLHDL, VLDL, LDLCALC  See Psychiatric Specialty Exam and Suicide Risk Assessment completed by Attending Physician prior to discharge.  Discharge destination:  Home  Is patient on multiple antipsychotic therapies at discharge:  No   Has Patient had three or more failed trials of antipsychotic monotherapy by history:  No  Recommended Plan for Multiple Antipsychotic Therapies: NA  Allergies as of 03/12/2017   No Known Allergies     Medication List    TAKE these medications     Indication  ibuprofen 200 MG tablet Commonly known as:  ADVIL,MOTRIN Take 3 tablets (600 mg total) by mouth every 6 (six) hours as needed for headache.  Indication:  Mild to Moderate Pain   sertraline 50 MG tablet Commonly known as:  ZOLOFT Take 1 tablet (50 mg total) by mouth daily. For depression  Indication:  Major Depressive Disorder   traZODone  50 MG tablet Commonly known as:  DESYREL Take 1 tablet (50 mg total) by mouth at bedtime as needed for sleep.  Indication:  Trouble Sleeping      Follow-up Information    [facility] Follow up on 03/25/2017.   Specialty:  Behavioral Health Why:  Assessment with individual/family counselor on Mar 25, 2017 at 2:00PM with Danice Goltz. She will schedule you for medication management appt.Please arrive by 1:45PM to complete new patient paperwork and bring insurance card to this  appt. Thank you!  Contact information: [address] [place] [address] [phone]        [facility] Follow up.   Specialty:  Behavioral Health Why:  If you want to be seen for hospital follow-up prior to your first [facility] appt, feel free to walk in to [facility] Monday through Friday between 8am-9am. Thank you.  [address] [place] [address] [phone]          Follow-up recommendations: Activity:  As tolerated Diet: As recommended by your primary care doctor. Keep all scheduled follow-up appointments as recommended.    Comments: Patient is instructed prior to discharge to: Take all medications as prescribed by his/her mental healthcare provider. Report any adverse effects and or reactions from the medicines to his/her outpatient provider promptly. Patient has been instructed & cautioned: To not engage in alcohol and or illegal drug use while on prescription medicines. In the event of worsening symptoms, patient is instructed to call the crisis hotline, 911 and or go to the nearest ED for appropriate evaluation and treatment of symptoms. To follow-up with his/her primary care provider for your other medical issues, concerns and or health care needs.   Signed: Sanjuana Kava, NP, PMHNP, FNP-BC 03/13/2017, 9:44 AM

## 2017-03-12 NOTE — Progress Notes (Signed)
Pt discharged home with a friend. Pt was ambulatory, stable and appreciative at that time. All papers and prescriptions were given and valuables returned. Verbal understanding expressed. Denies SI/HI and A/VH. Pt given opportunity to express concerns and ask questions.  

## 2017-03-12 NOTE — BHH Suicide Risk Assessment (Signed)
[facility] Discharge Suicide Risk Assessment   Principal Problem: Major depressive disorder, recurrent severe without psychotic features [facility]) Discharge Diagnoses:  Patient Active Problem List   Diagnosis Date Noted  . Major depressive disorder, recurrent severe without psychotic features ([facility]) [F33.2] 03/08/2017  . Myoclonus [G25.3] 09/02/2015    Total Time spent with patient: 45 minutes  Musculoskeletal: Strength & Muscle Tone: within normal limits Gait & Station: normal Patient leans: N/A  Psychiatric Specialty Exam: Review of Systems  Constitutional: Negative.   HENT: Negative.   Eyes: Negative.   Respiratory: Negative.   Cardiovascular: Negative.   Gastrointestinal: Negative.   Genitourinary: Negative.   Musculoskeletal: Negative.   Skin: Negative.   Neurological: Negative.   Endo/Heme/Allergies: Negative.   Psychiatric/Behavioral: Negative for depression, hallucinations, memory loss, substance abuse and suicidal ideas. The patient is not nervous/anxious and does not have insomnia.     Blood pressure 108/76, pulse 91, temperature 98.5 F (36.9 C), temperature source Oral, resp. rate 18, height  (1.702 m), weight 71.2 kg (157 lb).Body mass index is 24.59 kg/m.  General Appearance: Neatly dressed, pleasant, engaging well and cooperative. Appropriate behavior. Not in any distress. Good relatedness. Not internally stimulated  Eye Contact::  Good  Speech:  Spontaneous, normal prosody. Normal tone and rate.   Volume:  Normal  Mood:  Euthymic  Affect:  Appropriate and Full Range  Thought Process:  Linear  Orientation:  Full (Time, Place, and Person)  Thought Content:  Future oriented. No delusional theme. No preoccupation with violent thoughts. No negative ruminations. No obsession.  No hallucination in any modality.   Suicidal Thoughts:  No  Homicidal Thoughts:  No  Memory:  Immediate;   Good Recent;   Good Remote;   Good  Judgement:  Good  Insight:  Good  Psychomotor  Activity:  Normal  Concentration:  Good  Recall:  Good  Fund of Knowledge:Good  Language: Good  Akathisia:  No  Handed:    AIMS (if indicated):     Assets:  Communication Skills Desire for Improvement Financial Resources/Insurance Housing Leisure Time Physical Health Resilience Talents/Skills Transportation Vocational/Educational  Sleep:  Number of Hours: 6  Cognition: WNL  ADL's:  Intact   Clinical Assessment::   36 yo Caucasian male, divorced, lives alone, 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 Reports suicidal thoughts. Has thoughts of shooting himself. Patient has access to multiple weapons. Patient reports main stressor as recent break-up with his girlfriend and poor relationship with his mom. He reports worsening symptoms of depression. Has guilty ruminations, has lost confidence in himself and feels he is a disappointment to everyone. Significantly, patients father committed suicide in August 2016. No past history of suicidal behavior. No past psychiatric hospitalization. No history of trauma.   Seen today. In good spirits. Mood has lifted. No longer having suicidal thoughts. Has much better self esteem. Hopefull about the future. Pleased with the support he has been getting from his family. Has been sleeping well at night. Reports normal energy and appetite. No evidence of mania. No evidence of psychosis. No overwhelming anxiety. No thoughts of homicide. No thoughts of violence.   Nursing staff reports that patient has been appropriate on the unit. Patient has been interacting well with peers. No behavioral issues. Patient has not voiced any suicidal thoughts. Patient has not been observed to be internally stimulated. Patient has been adherent with treatment recommendations. Patient has been tolerating their medication well.   Patient was discussed at  team. Team members feels that patient is back to his baseline level of function. Team  agrees with plan to discharge patient today.  Demographic Factors:  Male, Divorced or widowed, Caucasian and Living alone  Loss Factors: NA  Historical Factors: Family history of suicide  Risk Reduction Factors:   Sense of responsibility to family, Religious beliefs about death, Employed, Positive social support, Positive therapeutic relationship and Positive coping skills or problem solving skills  Continued Clinical Symptoms:  As above   Cognitive Features That Contribute To Risk:  None    Suicide Risk:  Minimal: No identifiable suicidal ideation.   Patient is not having any thoughts of suicide at this time. Modifiable risk factors targeted during this admission  depression. Demographical and historical risk factors cannot be modified. Patient is now engaging well. Patient is reliable and is future oriented. We have buffered patient's support structures. At this point, patient is at low risk of suicide. Patient is aware of the effects of psychoactive substances on decision making process. Patient has been provided with emergency contacts. Patient acknowledges to use resources provided if unforseen circumstances changes their current risk stratification.    Follow-up Information    [facility] Follow up on 03/25/2017.   Specialty:  Behavioral Health Why:  Assessment with individual/family counselor on Mar 25, 2017 at 2:00PM with Danice Goltz. She will schedule you for medication management appt.Please arrive by 1:45PM to complete new patient paperwork and bring insurance card to this appt. Thank you!  Contact information: [address] [place] [address] [phone]        [facility] Follow up.   Specialty:  Behavioral Health Why:  If you want to be seen for hospital follow-up prior to your first [facility] appt, feel free to walk in to [facility] Monday through Friday between 8am-9am. Thank you.  Contact information: [address] [place]  [address] [phone]           Plan Of Care/Follow-up recommendations:  1. Continue current psychotropic medications 2. Mental health follow up as arranged.  3. Discharge in care of his family    Georgiann Cocker, MD 03/12/2017, 10:21 AM

## 2017-03-12 NOTE — BHH Suicide Risk Assessment (Signed)
CSW notified Gonzella Lex Audiological scientist) of pt's gun ownership and access to guns prior to admission in addition to CSW's conversation with pt's mother where she stated that she would be removing all guns/firearms/weapons from his home prior to discharge.   Trula Slade, MSW, LCSW Clinical Social Worker 03/12/2017 1:20 PM

## 2017-03-12 NOTE — Plan of Care (Signed)
Problem: Self-Concept: Goal: Level of anxiety will decrease Outcome: Progressing Reports decreased anxiety  Problem: Medication: Goal: Compliance with prescribed medication regimen will improve Outcome: Progressing Pt is medication compliant

## 2018-12-19 ENCOUNTER — Emergency Department (HOSPITAL_BASED_OUTPATIENT_CLINIC_OR_DEPARTMENT_OTHER): Payer: BLUE CROSS/BLUE SHIELD

## 2018-12-19 ENCOUNTER — Encounter (HOSPITAL_BASED_OUTPATIENT_CLINIC_OR_DEPARTMENT_OTHER): Payer: Self-pay | Admitting: Emergency Medicine

## 2018-12-19 ENCOUNTER — Other Ambulatory Visit: Payer: Self-pay

## 2018-12-19 ENCOUNTER — Emergency Department (HOSPITAL_BASED_OUTPATIENT_CLINIC_OR_DEPARTMENT_OTHER)
Admission: EM | Admit: 2018-12-19 | Discharge: 2018-12-19 | Disposition: A | Discharge status: Home / Self Care | Payer: BLUE CROSS/BLUE SHIELD | Attending: Emergency Medicine | Admitting: Emergency Medicine

## 2018-12-19 DIAGNOSIS — N200 Calculus of kidney: Secondary | ICD-10-CM | POA: Insufficient documentation

## 2018-12-19 DIAGNOSIS — R109 Unspecified abdominal pain: Secondary | ICD-10-CM

## 2018-12-19 DIAGNOSIS — Z79899 Other long term (current) drug therapy: Secondary | ICD-10-CM | POA: Insufficient documentation

## 2018-12-19 LAB — URINALYSIS, MICROSCOPIC (REFLEX)

## 2018-12-19 LAB — CBC WITH DIFFERENTIAL/PLATELET
Abs Immature Granulocytes: 0.03 10*3/uL (ref 0.00–0.07)
Basophils Absolute: 0.1 10*3/uL (ref 0.0–0.1)
Basophils Relative: 1 %
EOS PCT: 2 %
Eosinophils Absolute: 0.2 10*3/uL (ref 0.0–0.5)
HEMATOCRIT: 46.9 % (ref 39.0–52.0)
HEMOGLOBIN: 15.5 g/dL (ref 13.0–17.0)
IMMATURE GRANULOCYTES: 0 %
LYMPHS ABS: 2.5 10*3/uL (ref 0.7–4.0)
LYMPHS PCT: 35 %
MCH: 30.1 pg (ref 26.0–34.0)
MCHC: 33 g/dL (ref 30.0–36.0)
MCV: 91.1 fL (ref 80.0–100.0)
MONOS PCT: 7 %
Monocytes Absolute: 0.5 10*3/uL (ref 0.1–1.0)
Neutro Abs: 3.8 10*3/uL (ref 1.7–7.7)
Neutrophils Relative %: 55 %
Platelets: 223 10*3/uL (ref 150–400)
RBC: 5.15 MIL/uL (ref 4.22–5.81)
RDW: 12 % (ref 11.5–15.5)
WBC: 6.9 10*3/uL (ref 4.0–10.5)
nRBC: 0 % (ref 0.0–0.2)

## 2018-12-19 LAB — URINALYSIS, ROUTINE W REFLEX MICROSCOPIC
BILIRUBIN URINE: NEGATIVE
Glucose, UA: NEGATIVE mg/dL
KETONES UR: NEGATIVE mg/dL
Leukocytes, UA: NEGATIVE
Nitrite: NEGATIVE
PROTEIN: 30 mg/dL — AB
Specific Gravity, Urine: >1.03 — ABNORMAL HIGH (ref 1.005–1.030)
pH: 5.5 (ref 5.0–8.0)

## 2018-12-19 LAB — COMPREHENSIVE METABOLIC PANEL
ALK PHOS: 85 U/L (ref 38–126)
ALT: 150 U/L — ABNORMAL HIGH (ref 0–44)
AST: 54 U/L — AB (ref 15–41)
Albumin: 4.6 g/dL (ref 3.5–5.0)
Anion gap: 7 (ref 5–15)
BILIRUBIN TOTAL: 1.4 mg/dL — AB (ref 0.3–1.2)
BUN: 16 mg/dL (ref 6–20)
CALCIUM: 9.3 mg/dL (ref 8.9–10.3)
CO2: 28 mmol/L (ref 22–32)
Chloride: 103 mmol/L (ref 98–111)
Creatinine, Ser: 1.37 mg/dL — ABNORMAL HIGH (ref 0.61–1.24)
GFR calc Af Amer: >60 mL/min (ref >60)
GLUCOSE: 103 mg/dL — AB (ref 70–99)
POTASSIUM: 3.8 mmol/L (ref 3.5–5.1)
Sodium: 138 mmol/L (ref 135–145)
TOTAL PROTEIN: 7.5 g/dL (ref 6.5–8.1)

## 2018-12-19 MED ORDER — MORPHINE SULFATE (PF) 4 MG/ML IV SOLN
4.0000 mg | Freq: Once | INTRAVENOUS | Status: AC
  Administered 2018-12-19: 4 mg via INTRAVENOUS
  Filled 2018-12-19: qty 1

## 2018-12-19 MED ORDER — ONDANSETRON 4 MG PO TBDP: 4.0000 mg | ORAL_TABLET | Freq: Three times a day (TID) | ORAL | 0 refills | Status: AC | PRN

## 2018-12-19 MED ORDER — SODIUM CHLORIDE 0.9 % IV BOLUS
1000.0000 mL | Freq: Once | INTRAVENOUS | Status: AC
  Administered 2018-12-19: 1000 mL via INTRAVENOUS

## 2018-12-19 MED ORDER — TAMSULOSIN HCL 0.4 MG PO CAPS: 0.4000 mg | ORAL_CAPSULE | Freq: Every day | ORAL | 0 refills | Status: DC

## 2018-12-19 MED ORDER — OXYCODONE HCL 5 MG PO TABS: 5.0000 mg | ORAL_TABLET | ORAL | 0 refills | Status: AC | PRN

## 2018-12-19 MED ORDER — KETOROLAC TROMETHAMINE 15 MG/ML IJ SOLN
15.0000 mg | Freq: Once | INTRAMUSCULAR | Status: AC
  Administered 2018-12-19: 15 mg via INTRAVENOUS
  Filled 2018-12-19: qty 1

## 2018-12-19 MED FILL — oxyCODONE HCL 5 MG TABS: 5 | 3 days supply | Qty: 15 | Fill #0

## 2018-12-19 MED FILL — ONDANSETRON ODT 4 MG TABLET: 4 | 5 days supply | Qty: 15 | Fill #0

## 2018-12-19 MED FILL — TAMSULOSIN HCL 0.4 MG CAP: 0.4 | 15 days supply | Qty: 15 | Fill #0

## 2018-12-19 NOTE — Discharge Instructions (Addendum)
Take Tylenol 1000 mg 4 times a day for 1 week. This is the maximum dose of Tylenol (acetaminophen) you can take from all sources. Please check other over-the-counter medications and prescriptions to ensure you are not taking other medications that contain acetaminophen.  You may also take ibuprofen 400 mg 6 times a day alternating with or at the same time as tylenol.  Take oxycodone as needed for breakthrough pain.  This medication can be addicting, sedating and cause constipation.   °

## 2018-12-19 NOTE — ED Triage Notes (Signed)
Pt with hx of kidney stones presents with right side pain. Denies fever/nv/d/. Recent DOT physical showed trace blood in urine, but denies hematuria. NAD at triage.

## 2018-12-19 NOTE — ED Notes (Signed)
NAD at this time. Pt is stable and going home.  

## 2018-12-19 NOTE — ED Provider Notes (Signed)
[facility] EMERGENCY DEPARTMENT Provider Note   CSN: [id number] Arrival date & time: 12/19/18  0903     History   Chief Complaint Chief Complaint  Patient presents with  . Flank Pain    HPI Ralph PoagJustin J Barajas is a 38 y.o. male.  HPI   38 year old male with a history of depression, nephrolithiasis presents with right flank pain and right-sided abdominal pain.  Reports he has had some intermittent pain over the last couple days, but pain became more severe this morning when he woke up.  Reports sharp severe pain, rated 8 out of 10, which radiated from his right side towards his right lower quadrant.  Denies nausea, vomiting, diarrhea, fever, dysuria.  Reports his appetite has been normal.  No history of surgery.  Reports nothing seems to make the pain better or worse, is not worse with eating.  Reports it may feel little better when he moves around.  History of kidney stone was a long time ago, feels like it is similar, but has been a long time.  No history of abdominal surgery. Worse also with deep breaths but no dyspnea, n ocough.   Past Medical History:  Diagnosis Date  . Depression     Patient Active Problem List   Diagnosis Date Noted  . Major depressive disorder, recurrent severe without psychotic features (HCC) 03/08/2017  . Myoclonus 09/02/2015    History reviewed. No pertinent surgical history.      Home Medications    Prior to Admission medications   Medication Sig Start Date End Date Taking? Authorizing Provider  ibuprofen (ADVIL,MOTRIN) 200 MG tablet Take 3 tablets (600 mg total) by mouth every 6 (six) hours as needed for headache. 03/12/17   Armandina StammerNwoko, Agnes I, NP  ondansetron (ZOFRAN ODT) 4 MG disintegrating tablet Take 1 tablet (4 mg total) by mouth every 8 (eight) hours as needed for nausea or vomiting. 12/19/18   Alvira MondaySchlossman, Zona Pedro, MD  oxyCODONE (ROXICODONE) 5 MG immediate release tablet Take 1 tablet (5 mg total) by mouth every 4 (four) hours as needed for  severe pain. 12/19/18   Alvira MondaySchlossman, Jemimah Cressy, MD  sertraline (ZOLOFT) 50 MG tablet Take 1 tablet (50 mg total) by mouth daily. For depression 03/13/17   Armandina StammerNwoko, Agnes I, NP  tamsulosin (FLOMAX) 0.4 MG CAPS capsule Take 1 capsule (0.4 mg total) by mouth daily. 12/19/18   Alvira MondaySchlossman, Bricyn Labrada, MD  traZODone (DESYREL) 50 MG tablet Take 1 tablet (50 mg total) by mouth at bedtime as needed for sleep. 03/12/17   Sanjuana KavaNwoko, Agnes I, NP    Family History Family History  Problem Relation Age of Onset  . Heart attack Father   . Stroke Father   . Seizures Maternal Uncle   . Anxiety disorder Maternal Grandmother     Social History Social History   Tobacco Use  . Smoking status: Never Smoker  . Smokeless tobacco: Never Used  Substance Use Topics  . Alcohol use: No  . Drug use: No     Allergies   Patient has no known allergies.   Review of Systems Review of Systems  Constitutional: Negative for fever.  HENT: Negative for sore throat.   Eyes: Negative for visual disturbance.  Respiratory: Negative for shortness of breath.   Cardiovascular: Negative for chest pain.  Gastrointestinal: Positive for abdominal pain. Negative for diarrhea, nausea and vomiting.  Genitourinary: Positive for flank pain. Negative for difficulty urinating (urinating less). Hematuria: had microscopic on DOT physical recently.  Musculoskeletal: Negative for back  pain and neck stiffness.  Skin: Negative for rash.  Neurological: Negative for syncope and headaches.     Physical Exam Updated Vital Signs BP 119/78   Pulse 94   Temp 98.1 F (36.7 C) (Oral)   Resp 17   SpO2 98%   Physical Exam Vitals signs and nursing note reviewed.  Constitutional:      General: He is not in acute distress.    Appearance: He is well-developed. He is not diaphoretic.  HENT:     Head: Normocephalic and atraumatic.  Eyes:     Conjunctiva/sclera: Conjunctivae normal.  Neck:     Musculoskeletal: Normal range of motion.  Cardiovascular:      Rate and Rhythm: Normal rate and regular rhythm.     Heart sounds: Normal heart sounds. No murmur. No friction rub. No gallop.   Pulmonary:     Effort: Pulmonary effort is normal. No respiratory distress.     Breath sounds: Normal breath sounds. No wheezing or rales.  Abdominal:     General: There is no distension.     Palpations: Abdomen is soft.     Tenderness: There is abdominal tenderness (right side towards flank but no CVA tenderness, no RUQ pain, mild RLQ tenderness). There is no guarding.  Skin:    General: Skin is warm and dry.  Neurological:     Mental Status: He is alert and oriented to person, place, and time.      ED Treatments / Results  Labs (all labs ordered are listed, but only abnormal results are displayed) Labs Reviewed  COMPREHENSIVE METABOLIC PANEL - Abnormal; Notable for the following components:      Result Value   Glucose, Bld 103 (*)    Creatinine, Ser 1.37 (*)    AST 54 (*)    ALT 150 (*)    Total Bilirubin 1.4 (*)    All other components within normal limits  URINALYSIS, ROUTINE W REFLEX MICROSCOPIC - Abnormal; Notable for the following components:   Specific Gravity, Urine >1.030 (*)    Hgb urine dipstick LARGE (*)    Protein, ur 30 (*)    All other components within normal limits  URINALYSIS, MICROSCOPIC (REFLEX) - Abnormal; Notable for the following components:   Bacteria, UA MANY (*)    All other components within normal limits  URINE CULTURE  CBC WITH DIFFERENTIAL/PLATELET    EKG None  Radiology Dg Abdomen 1 View  Result Date: 12/19/2018 CLINICAL DATA:  Nephrolithiasis and right flank pain EXAM: ABDOMEN - 1 VIEW COMPARISON:  01/17/2013 FINDINGS: 5 mm angular stone overlaps the right flank at the L2 level, likely in the upper ureter. A 4 mm stone is seen over the left kidney. Normal bowel gas pattern. Asymmetric elevation of the right diaphragm. IMPRESSION: 1. 5 mm stone over the right flank (at L2) compatible with upper ureteral calculus.  2. 4 mm left nephrolithiasis. Electronically Signed   By: Marnee Spring M.D.   On: 12/19/2018 09:38   US Renal  Result Date: 12/19/2018 CLINICAL DATA:  Right flank pain, blood in urine, history of kidney stones EXAM: RENAL / URINARY TRACT ULTRASOUND COMPLETE COMPARISON:  None. FINDINGS: Right Kidney: Renal measurements: 11.6 x 5.8 x 5.9 cm = volume: 207 mL. Normal parenchymal echogenicity. 6 mm interpolar calculus. Mild right hydronephrosis with suspected 11 mm proximal ureteral calculus. Left Kidney: Renal measurements: 11.9 x 4.9 x 5.7 cm = volume: 174 mL. Normal parenchymal echogenicity. 6 mm interpolar calculus. No hydronephrosis. Bladder: Appears normal  for degree of bladder distention. Additional comments: Splenomegaly, measuring 14.3 x 4.6 x 14.9 cm (calculated volume 507 mL). Hyperechoic hepatic parenchyma, suggesting hepatic steatosis. IMPRESSION: Mild right hydronephrosis with suspected 11 mm proximal right ureteral calculus. Bilateral renal calculi measuring 6 mm. Incidentally noted is splenomegaly and suspected hepatic steatosis. Electronically Signed   By: Charline BillsSriyesh  Krishnan M.D.   On: 12/19/2018 10:14    Procedures Procedures (including critical care time)  Medications Ordered in ED Medications  sodium chloride 0.9 % bolus 1,000 mL (0 mLs Intravenous Stopped 12/19/18 1259)  ketorolac (TORADOL) 15 MG/ML injection 15 mg (15 mg Intravenous Given 12/19/18 1004)  morphine 4 MG/ML injection 4 mg (4 mg Intravenous Given 12/19/18 1149)     Initial Impression / Assessment and Plan / ED Course  I have reviewed the triage vital signs and the nursing notes.  Pertinent labs & imaging results that were available during my care of the patient were reviewed by me and considered in my medical decision making (see chart for details).     38 year old male with a history of depression, nephrolithiasis presents with right flank pain and right-sided abdominal pain.  High suspicion by history and  physical exam is for nephrolithiasis, given history of same, have ordered renal ultrasound and KUB.  Differential diagnoses also includes cholelithiasis, cholecystitis, appendicitis, musculoskeletal pain.  Have low suspicion for pulmonary embolus given no shortness of breath, no tachycardia, no hypoxia, no asymmetric leg swelling.  Have initial low suspicion for appendicitis given normal appetite, no fevers.  Exam is not consistent with cholecystitis.  Labs obtained show mild elevation in Cr in comparison to prior, normal GFR. Mild elevation in transaminases, fatty liver.  X-ray shows 5mm right sided suspected upper ureteral calculus.  Urinalysis shows hematuria, no WBC. Will send for cx. US shows hydronephrosis, suspected 11mm stone.  Discussed with Dr. Wilson SingerWren of Urology, suspect stone smaller than 10mm, pt ok for outpt follow up if pain controlled.  Patient pain down to 2/10 after toradol. Pain worsened in ED, given additional medicine.  Recommend flomax, hydration, pain control. Reviewed [place] drug database with no narcotic rx. Discussed risks of narcotic medications.  Recommend follow up with Urology.   Final Clinical Impressions(s) / ED Diagnoses   Final diagnoses:  Right sided abdominal pain  Nephrolithiasis    ED Discharge Orders         Ordered    oxyCODONE (ROXICODONE) 5 MG immediate release tablet  Every 4 hours PRN     12/19/18 1132    ondansetron (ZOFRAN ODT) 4 MG disintegrating tablet  Every 8 hours PRN     12/19/18 1132    tamsulosin (FLOMAX) 0.4 MG CAPS capsule  Daily     12/19/18 1132           Alvira MondaySchlossman, Destynie Toomey, MD 12/19/18 2145

## 2018-12-20 ENCOUNTER — Other Ambulatory Visit: Payer: Self-pay | Admitting: Urology

## 2018-12-20 ENCOUNTER — Encounter (HOSPITAL_COMMUNITY): Payer: Self-pay | Admitting: *Deleted

## 2018-12-20 LAB — URINE CULTURE: CULTURE: NO GROWTH

## 2018-12-20 NOTE — Progress Notes (Signed)
Patient instructed to arrive 1430 on 12/22/2018 to admitting. To bring blue folder, responsible driver, and insurance card. May have solids until 10 AM and clear liquids until 1130 AM on 12/22/2018. NPO after that. No aspirin or ibuprofen products until after the procedure. He verbalizes understanding.

## 2018-12-22 ENCOUNTER — Ambulatory Visit (HOSPITAL_COMMUNITY): Payer: BLUE CROSS/BLUE SHIELD

## 2018-12-22 ENCOUNTER — Other Ambulatory Visit: Payer: Self-pay

## 2018-12-22 ENCOUNTER — Encounter (HOSPITAL_COMMUNITY)
Admission: RE | Disposition: A | Discharge status: Home / Self Care | Payer: Self-pay | Source: Ambulatory Visit | Attending: Urology

## 2018-12-22 ENCOUNTER — Encounter (HOSPITAL_COMMUNITY): Payer: Self-pay | Admitting: *Deleted

## 2018-12-22 ENCOUNTER — Ambulatory Visit (HOSPITAL_COMMUNITY)
Admission: RE | Admit: 2018-12-22 | Discharge: 2018-12-22 | Disposition: A | Discharge status: Home / Self Care | Payer: BLUE CROSS/BLUE SHIELD | Source: Ambulatory Visit | Attending: Urology | Admitting: Urology

## 2018-12-22 DIAGNOSIS — F329 Major depressive disorder, single episode, unspecified: Secondary | ICD-10-CM | POA: Insufficient documentation

## 2018-12-22 DIAGNOSIS — N201 Calculus of ureter: Secondary | ICD-10-CM | POA: Insufficient documentation

## 2018-12-22 DIAGNOSIS — Z79899 Other long term (current) drug therapy: Secondary | ICD-10-CM | POA: Diagnosis not present

## 2018-12-22 HISTORY — PX: EXTRACORPOREAL SHOCK WAVE LITHOTRIPSY: SHX1557

## 2018-12-22 HISTORY — DX: Personal history of urinary calculi: Z87.442

## 2018-12-22 SURGERY — LITHOTRIPSY, ESWL
Anesthesia: LOCAL | Laterality: Right

## 2018-12-22 MED ORDER — CIPROFLOXACIN HCL 500 MG PO TABS
500.0000 mg | ORAL_TABLET | ORAL | Status: AC
  Administered 2018-12-22: 500 mg via ORAL
  Filled 2018-12-22: qty 1

## 2018-12-22 MED ORDER — SODIUM CHLORIDE 0.9 % IV SOLN
INTRAVENOUS | Status: DC
  Administered 2018-12-22: 16:00:00 via INTRAVENOUS

## 2018-12-22 MED ORDER — DIAZEPAM 5 MG PO TABS
10.0000 mg | ORAL_TABLET | ORAL | Status: AC
  Administered 2018-12-22: 10 mg via ORAL
  Filled 2018-12-22: qty 2

## 2018-12-22 MED ORDER — DIPHENHYDRAMINE HCL 25 MG PO CAPS
25.0000 mg | ORAL_CAPSULE | ORAL | Status: AC
  Administered 2018-12-22: 25 mg via ORAL
  Filled 2018-12-22: qty 1

## 2018-12-22 NOTE — Op Note (Signed)
See [facility] OP note scanned into chart. Also because of the size, density, location and other factors that cannot be anticipated I feel this will likely be a staged procedure. This fact supersedes any indication in the scanned [facility] operative note to the contrary.  

## 2018-12-22 NOTE — H&P (Signed)
See scanned H&P

## 2018-12-23 ENCOUNTER — Encounter (HOSPITAL_COMMUNITY): Payer: Self-pay | Admitting: Urology

## 2019-09-05 ENCOUNTER — Other Ambulatory Visit: Payer: Self-pay

## 2019-09-05 ENCOUNTER — Encounter (HOSPITAL_BASED_OUTPATIENT_CLINIC_OR_DEPARTMENT_OTHER): Payer: Self-pay | Admitting: *Deleted

## 2019-09-05 ENCOUNTER — Emergency Department (HOSPITAL_BASED_OUTPATIENT_CLINIC_OR_DEPARTMENT_OTHER): Payer: BC Managed Care – PPO

## 2019-09-05 ENCOUNTER — Emergency Department (HOSPITAL_BASED_OUTPATIENT_CLINIC_OR_DEPARTMENT_OTHER)
Admission: EM | Admit: 2019-09-05 | Discharge: 2019-09-05 | Disposition: A | Discharge status: Home / Self Care | Payer: BC Managed Care – PPO | Attending: Emergency Medicine | Admitting: Emergency Medicine

## 2019-09-05 DIAGNOSIS — N201 Calculus of ureter: Secondary | ICD-10-CM | POA: Diagnosis not present

## 2019-09-05 DIAGNOSIS — R1032 Left lower quadrant pain: Secondary | ICD-10-CM | POA: Diagnosis present

## 2019-09-05 MED ORDER — KETOROLAC TROMETHAMINE 30 MG/ML IJ SOLN
30.0000 mg | Freq: Once | INTRAMUSCULAR | Status: AC
  Administered 2019-09-05: 19:00:00 30 mg via INTRAVENOUS
  Filled 2019-09-05: qty 1

## 2019-09-05 MED ORDER — ONDANSETRON HCL 4 MG/2ML IJ SOLN
4.0000 mg | Freq: Once | INTRAMUSCULAR | Status: AC
  Administered 2019-09-05: 4 mg via INTRAVENOUS
  Filled 2019-09-05: qty 2

## 2019-09-05 MED ORDER — IBUPROFEN 800 MG PO TABS: 800.0000 mg | ORAL_TABLET | Freq: Three times a day (TID) | ORAL | 0 refills | Status: AC | PRN

## 2019-09-05 MED ORDER — OXYCODONE-ACETAMINOPHEN 5-325 MG PO TABS: 1.0000 | ORAL_TABLET | Freq: Four times a day (QID) | ORAL | 0 refills | Status: AC | PRN

## 2019-09-05 MED ORDER — TAMSULOSIN HCL 0.4 MG PO CAPS: 0.4000 mg | ORAL_CAPSULE | Freq: Every day | ORAL | 0 refills | Status: AC

## 2019-09-05 MED ORDER — OXYCODONE-ACETAMINOPHEN 5-325 MG PO TABS
1.0000 | ORAL_TABLET | Freq: Once | ORAL | Status: AC
  Administered 2019-09-05: 20:00:00 1 via ORAL
  Filled 2019-09-05: qty 1

## 2019-09-05 NOTE — ED Notes (Signed)
83 ml of urine on bladder scanner

## 2019-09-05 NOTE — Discharge Instructions (Signed)
You have been seen in the Emergency Department (ED) today for pain that we believe based on your workup, is caused by kidney stones.  As we have discussed, please drink plenty of fluids.  Please make a follow up appointment with the physician(s) listed elsewhere in this documentation. ° °You may take pain medication as needed but ONLY as prescribed.  Please also take your prescribed Flomax daily.  We also recommend that you take over-the-counter ibuprofen regularly according to label instructions over the next 5 days.  Take it with meals to minimize stomach discomfort. ° °Please see your doctor as soon as possible as stones may take 1-3 weeks to pass and you may require additional care or medications. ° °Do not drink alcohol, drive or participate in any other potentially dangerous activities while taking opiate pain medication as it may make you sleepy. Do not take this medication with any other sedating medications, either prescription or over-the-counter. If you were prescribed Percocet or Vicodin, do not take these with acetaminophen (Tylenol) as it is already contained within these medications. °  °Take Percocet as needed for severe pain.  This medication is an opiate (or narcotic) pain medication and can be habit forming.  Use it as little as possible to achieve adequate pain control.  Do not use or use it with extreme caution if you have a history of opiate abuse or dependence.  If you are on a pain contract with your primary care doctor or a pain specialist, be sure to let them know you were prescribed this medication today from the Emergency Department.  This medication is intended for your use only - do not give any to anyone else and keep it in a secure place where nobody else, especially children, have access to it.  It will also cause or worsen constipation, so you may want to consider taking an over-the-counter stool softener while you are taking this medication. ° °Return to the Emergency Department  (ED) or call your doctor if you have any worsening pain, fever, painful urination, are unable to urinate, or develop other symptoms that concern you. ° ° ° °Kidney Stones °Kidney stones (urolithiasis) are deposits that form inside your kidneys. The intense pain is caused by the stone moving through the urinary tract. When the stone moves, the ureter goes into spasm around the stone. The stone is usually passed in the urine.  °CAUSES  °A disorder that makes certain neck glands produce too much parathyroid hormone (primary hyperparathyroidism). °A buildup of uric acid crystals, similar to gout in your joints. °Narrowing (stricture) of the ureter. °A kidney obstruction present at birth (congenital obstruction). °Previous surgery on the kidney or ureters. °Numerous kidney infections. °SYMPTOMS  °Feeling sick to your stomach (nauseous). °Throwing up (vomiting). °Blood in the urine (hematuria). °Pain that usually spreads (radiates) to the groin. °Frequency or urgency of urination. °DIAGNOSIS  °Taking a history and physical exam. °Blood or urine tests. °CT scan. °Occasionally, an examination of the inside of the urinary bladder (cystoscopy) is performed. °TREATMENT  °Observation. °Increasing your fluid intake. °Extracorporeal shock wave lithotripsy--This is a noninvasive procedure that uses shock waves to break up kidney stones. °Surgery may be needed if you have severe pain or persistent obstruction. There are various surgical procedures. Most of the procedures are performed with the use of small instruments. Only small incisions are needed to accommodate these instruments, so recovery time is minimized. °The size, location, and chemical composition are all important variables that will determine the proper   choice of action for you. Talk to your health care provider to better understand your situation so that you will minimize the risk of injury to yourself and your kidney.  °HOME CARE INSTRUCTIONS  °Drink enough water  and fluids to keep your urine clear or pale yellow. This will help you to pass the stone or stone fragments. °Strain all urine through the provided strainer. Keep all particulate matter and stones for your health care provider to see. The stone causing the pain may be as small as a grain of salt. It is very important to use the strainer each and every time you pass your urine. The collection of your stone will allow your health care provider to analyze it and verify that a stone has actually passed. The stone analysis will often identify what you can do to reduce the incidence of recurrences. °Only take over-the-counter or prescription medicines for pain, discomfort, or fever as directed by your health care provider. °Keep all follow-up visits as told by your health care provider. This is important. °Get follow-up X-rays if required. The absence of pain does not always mean that the stone has passed. It may have only stopped moving. If the urine remains completely obstructed, it can cause loss of kidney function or even complete destruction of the kidney. It is your responsibility to make sure X-rays and follow-ups are completed. Ultrasounds of the kidney can show blockages and the status of the kidney. Ultrasounds are not associated with any radiation and can be performed easily in a matter of minutes. °Make changes to your daily diet as told by your health care provider. You may be told to: °Limit the amount of salt that you eat. °Eat 5 or more servings of fruits and vegetables each day. °Limit the amount of meat, poultry, fish, and eggs that you eat. °Collect a 24-hour urine sample as told by your health care provider. You may need to collect another urine sample every 6-12 months. °SEEK MEDICAL CARE IF: °You experience pain that is progressive and unresponsive to any pain medicine you have been prescribed. °SEEK IMMEDIATE MEDICAL CARE IF:  °Pain cannot be controlled with the prescribed medicine. °You have a  fever or shaking chills. °The severity or intensity of pain increases over 18 hours and is not relieved by pain medicine. °You develop a new onset of abdominal pain. °You feel faint or pass out. °You are unable to urinate. °  °This information is not intended to replace advice given to you by your health care provider. Make sure you discuss any questions you have with your health care provider. °  °Document Released: 11/02/2005 Document Revised: 07/24/2015 Document Reviewed: 04/05/2013 °[employer]vier Interactive Patient Education ©2016 [employer] ° ° °

## 2019-09-05 NOTE — ED Triage Notes (Signed)
Left lateral abdominal pain x 2 hours. He took Ibuprofen with no relief. Same pain with a kidney stone in the past.

## 2019-09-05 NOTE — ED Provider Notes (Signed)
Emergency Department Provider Note   I have reviewed the triage vital signs and the nursing notes.   HISTORY  Chief Complaint Abdominal Pain   HPI Ralph Barajas is a 38 y.o. male with past history of kidney stones requiring lithotripsy presents to the emergency department with acute onset left flank pain radiating to the abdomen.  Symptoms began 2 hours prior to arrival and feels similar to kidney stones and has had in the past.  He has had 2 prior stones, 1 of which required intervention by urology.  He believes he follows with [facility] and saw them last sometime earlier this year.  He denies any fevers or shaking chills.  He took ibuprofen with no relief.  He did have one episode of vomiting and continues to have some nausea.  Pain is severe and intermittent.  Some radiation of pain noted to the anterior left lower quadrant of his abdomen.    Past Medical History:  Diagnosis Date   Depression    History of kidney stones     Patient Active Problem List   Diagnosis Date Noted   Major depressive disorder, recurrent severe without psychotic features (HCC) 03/08/2017   Myoclonus 09/02/2015    Past Surgical History:  Procedure Laterality Date   EXTRACORPOREAL SHOCK WAVE LITHOTRIPSY Right 12/22/2018   Procedure: EXTRACORPOREAL SHOCK WAVE LITHOTRIPSY (ESWL);  Surgeon: Crista Elliot, MD;  Location: [facility];  Service: Urology;  Laterality: Right;   TONSILLECTOMY      Allergies Patient has no known allergies.  Family History  Problem Relation Age of Onset   Heart attack Father    Stroke Father    Seizures Maternal Uncle    Anxiety disorder Maternal Grandmother     Social History Social History   Tobacco Use   Smoking status: Never Smoker   Smokeless tobacco: Never Used  Substance Use Topics   Alcohol use: No   Drug use: No    Review of Systems  Constitutional: No fever/chills Eyes: No visual changes. ENT: No sore throat. Cardiovascular: Denies  chest pain. Respiratory: Denies shortness of breath. Gastrointestinal: Positive left abdominal/flank pain. Positive nausea and vomiting.  No diarrhea.  No constipation. Genitourinary: Negative for dysuria. Musculoskeletal: Negative for back pain. Skin: Negative for rash. Neurological: Negative for headaches, focal weakness or numbness.  10-point ROS otherwise negative.  ____________________________________________   PHYSICAL EXAM:  VITAL SIGNS: ED Triage Vitals  Enc Vitals Group     BP 09/05/19 1809 (!) 143/103     Pulse Rate 09/05/19 1809 75     Resp 09/05/19 1809 20     Temp 09/05/19 1809 98 F (36.7 C)     Temp Source 09/05/19 1809 Oral     SpO2 09/05/19 1809 98 %     Weight 09/05/19 1806 180 lb (81.6 kg)     Height 09/05/19 1806 5\' 7"  (1.702 m)   Constitutional: Alert and oriented. Walking around the room and appears uncomfortable.  Eyes: Conjunctivae are normal.  Head: Atraumatic. Nose: No congestion/rhinnorhea. Mouth/Throat: Mucous membranes are moist.   Neck: No stridor.  Cardiovascular: Normal rate, regular rhythm. Good peripheral circulation. Grossly normal heart sounds.   Respiratory: Normal respiratory effort.  No retractions. Lungs CTAB. Gastrointestinal: Soft and nontender. No distention.  Musculoskeletal: No lower extremity tenderness nor edema. No gross deformities of extremities. Neurologic:  Normal speech and language. Skin:  Skin is warm, dry and intact. No rash noted. ____________________________________________  RADIOLOGY  Ct Renal Stone Study  Result  Date: 09/05/2019 CLINICAL DATA:  Left flank/lateral abdominal pain EXAM: CT ABDOMEN AND PELVIS WITHOUT CONTRAST TECHNIQUE: Multidetector CT imaging of the abdomen and pelvis was performed following the standard protocol without oral or IV contrast. COMPARISON:  January 17, 2013 FINDINGS: Lower chest: Lung bases are clear. Hepatobiliary: There is hepatic steatosis. No focal liver lesions are evident on this  noncontrast enhanced study. The gallbladder wall is not appreciably thickened. There is no biliary duct dilatation. Pancreas: There is no pancreatic mass or inflammatory focus. Spleen: Spleen measures 13.4 x 11.1 x 4.4 cm with a measured splenic volume of 327 cubic cm. No focal splenic lesions are evident. Adrenals/Urinary Tract: Adrenals bilaterally appear unremarkable. The left kidney is subtly edematous. There is no evident renal mass on either side. There is mild hydronephrosis on the left. There is no intrarenal calculus on either side. There is a calculus in the proximal left ureter at the L3-4 level measuring 4 x 3 mm. No other ureteral calculi are evident on either side. Urinary bladder wall thickness is within normal limits. Stomach/Bowel: There is no appreciable bowel wall or mesenteric thickening. No evident bowel obstruction. The terminal ileum appears normal. There is no evident free air or portal venous air. Vascular/Lymphatic: There is no abdominal aortic aneurysm. No vascular lesions are evident on this noncontrast enhanced study. There is no adenopathy in the abdomen or pelvis. Reproductive: Prostate and seminal vesicles appear normal in size and contour. There are a few tiny prostatic calculi. Other: The appendix appears normal. There is no abscess or ascites in the abdomen or pelvis. Musculoskeletal: There are no blastic or lytic bone lesions. There is no intramuscular or abdominal wall lesion. IMPRESSION: 1. 4 x 3 mm calculus in the proximal left ureter with mild hydronephrosis on the left. 2. No bowel obstruction. No abscess in the abdomen or pelvis. Appendix appears normal. 3.  Hepatic steatosis. Electronically Signed   By: Lowella Grip III M.D.   On: 09/05/2019 19:15    ____________________________________________   PROCEDURES  Procedure(s) performed:   Procedures  None ____________________________________________   INITIAL IMPRESSION / ASSESSMENT AND PLAN / ED  COURSE  Pertinent labs & imaging results that were available during my care of the patient were reviewed by me and considered in my medical decision making (see chart for details).   Patient presents to the emergency department with left flank pain similar to prior kidney stones.  He appears uncomfortable.  He has no anterior abdominal tenderness.  Plan for CT renal along with UA, culture, and pain control in the ED.   CT interpreted. Left ureteral stone noted. Patient with no urinary retention symptoms. Pain well controlled in the ED. Symptoms began without the last few hours and no UTI symptoms. Patient does not feel urge to urinate and requesting discharge. Will discharge with strict return precautions and Urology follow up information.    ____________________________________________  FINAL CLINICAL IMPRESSION(S) / ED DIAGNOSES  Final diagnoses:  Ureterolithiasis     MEDICATIONS GIVEN DURING THIS VISIT:  Medications  ketorolac (TORADOL) 30 MG/ML injection 30 mg (30 mg Intravenous Given 09/05/19 1836)  ondansetron (ZOFRAN) injection 4 mg (4 mg Intravenous Given 09/05/19 1835)  oxyCODONE-acetaminophen (PERCOCET/ROXICET) 5-325 MG per tablet 1 tablet (1 tablet Oral Given 09/05/19 2000)     NEW OUTPATIENT MEDICATIONS STARTED DURING THIS VISIT:  Discharge Medication List as of 09/05/2019 10:19 PM    START taking these medications   Details  oxyCODONE-acetaminophen (PERCOCET/ROXICET) 5-325 MG tablet Take 1 tablet by  mouth every 6 (six) hours as needed for severe pain., Starting Tue 09/05/2019, Normal        Note:  This document was prepared using Dragon voice recognition software and may include unintentional dictation errors.  Alona BeneJoshua Katianne Barre, MD, [facility]    Davanna He, Arlyss RepressJoshua G, MD 09/06/19 [phone]

## 2020-02-08 IMAGING — DX DG ABDOMEN 1V
2 series · 2 of 2 positions shown · non-contrast
Comparison: 01/17/2013

CLINICAL DATA: Nephrolithiasis and right flank pain

EXAM:
ABDOMEN - 1 VIEW

[abdomen kub (1 of 2)]
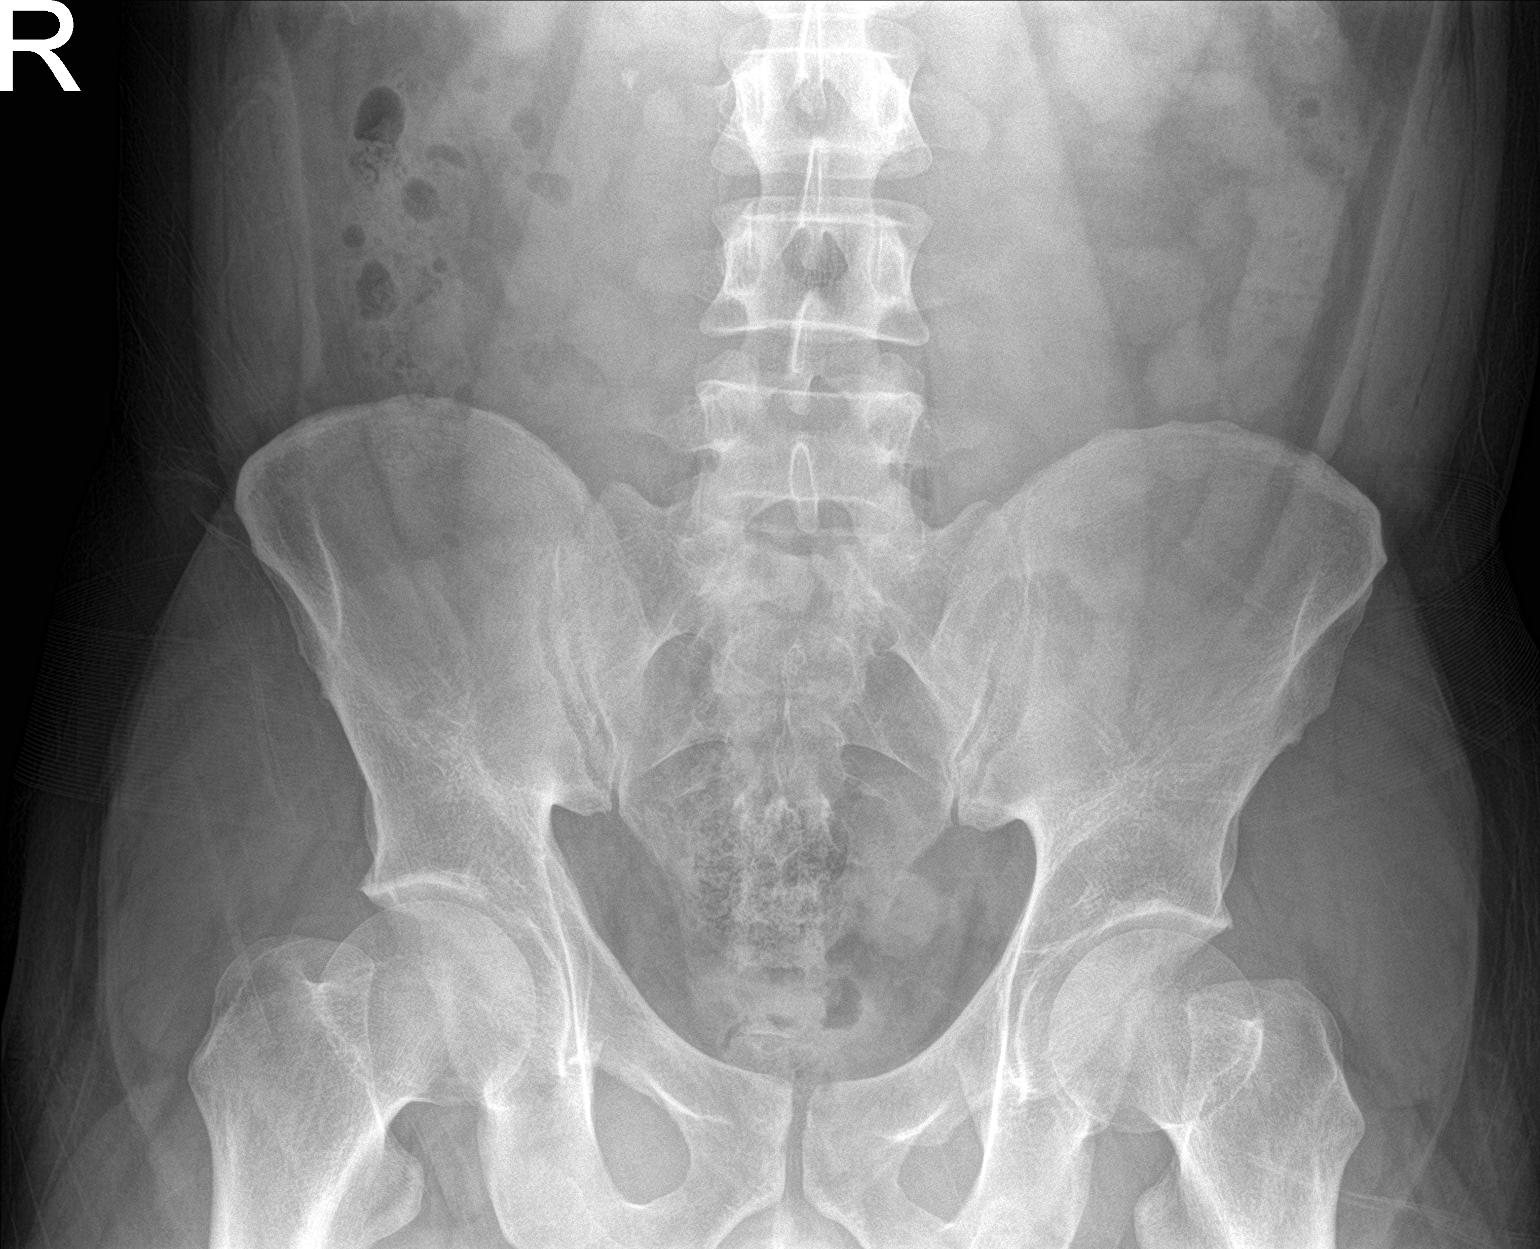

[abdomen kub (2 of 2)]
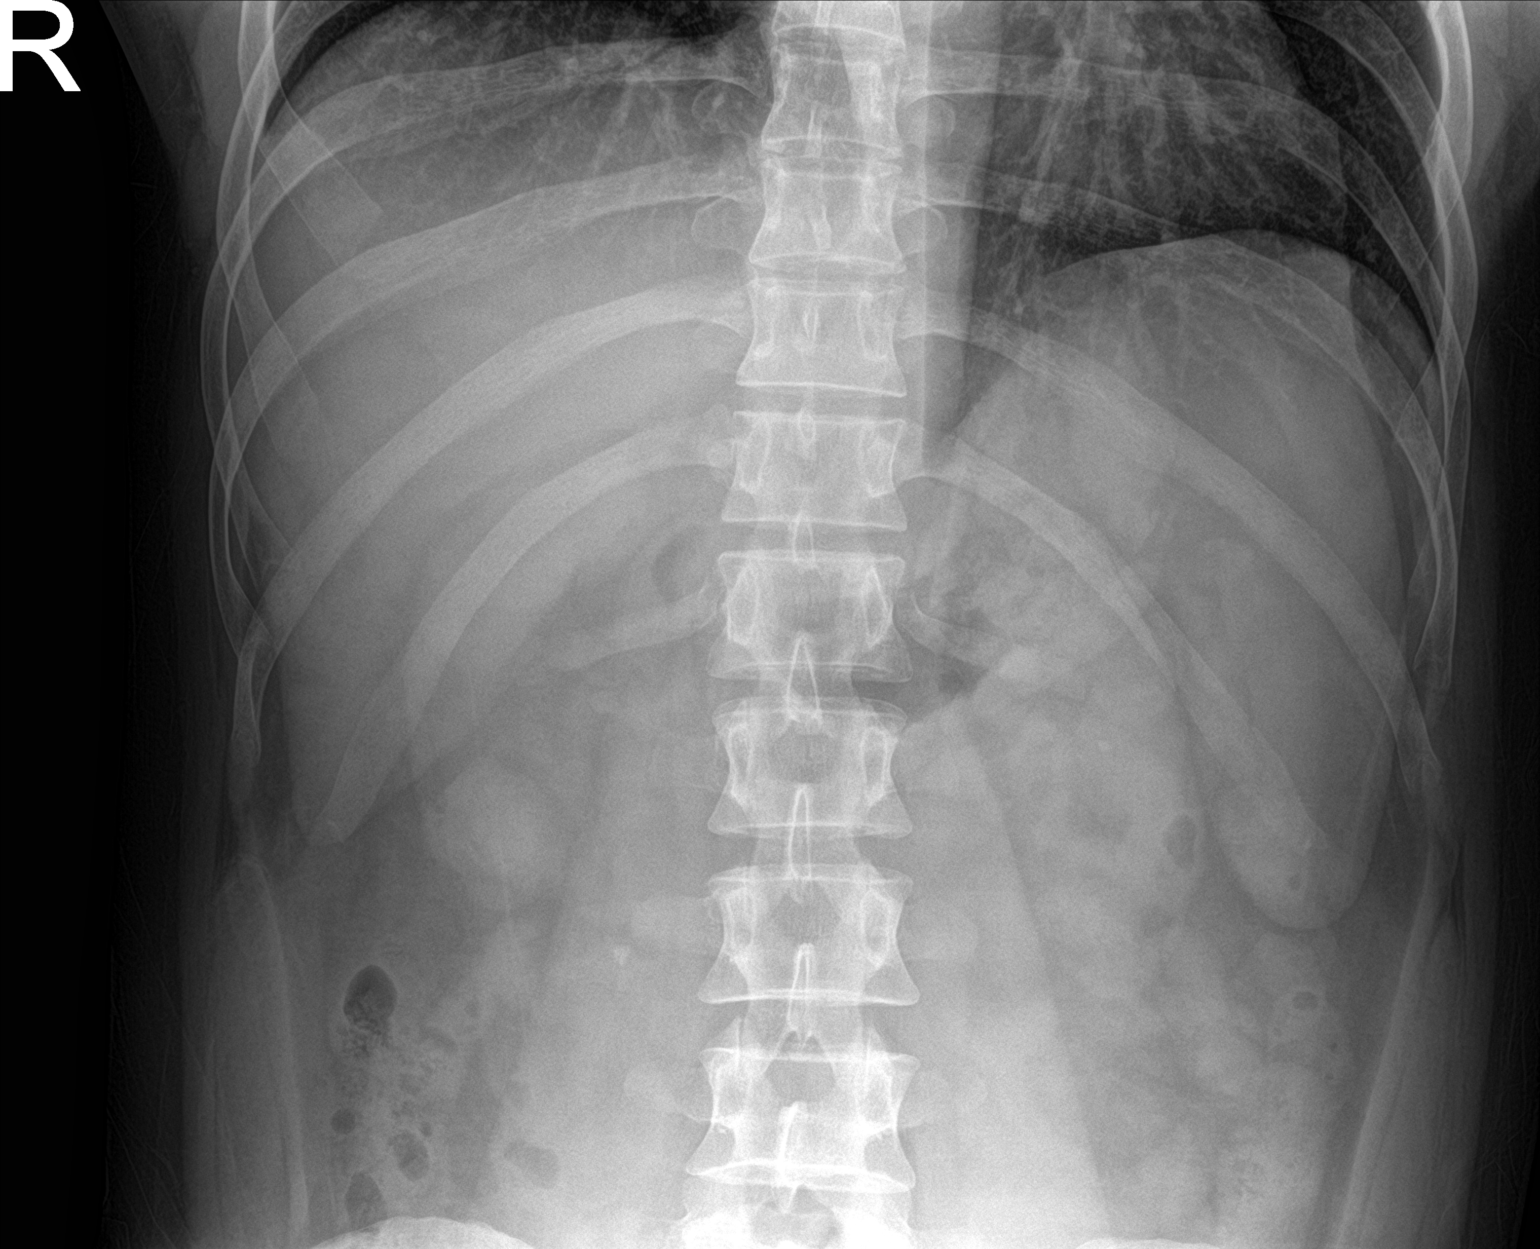

[2 of 2 positions shown; findings below may reference images not displayed]

FINDINGS: 5 mm angular stone overlaps the right flank at the L2 level, likely
in the upper ureter. A 4 mm stone is seen over the left kidney.
Normal bowel gas pattern. Asymmetric elevation of the right
diaphragm.
IMPRESSION: 1. 5 mm stone over the right flank (at L2) compatible with upper
ureteral calculus.
2. 4 mm left nephrolithiasis.

## 2021-07-30 ENCOUNTER — Other Ambulatory Visit: Payer: Self-pay | Admitting: Gastroenterology

## 2021-07-30 DIAGNOSIS — R7989 Other specified abnormal findings of blood chemistry: Secondary | ICD-10-CM

## 2021-08-05 ENCOUNTER — Ambulatory Visit
Admission: RE | Admit: 2021-08-05 | Discharge: 2021-08-05 | Disposition: A | Discharge status: Home / Self Care | Payer: BC Managed Care – PPO | Source: Ambulatory Visit | Attending: Gastroenterology | Admitting: Gastroenterology

## 2021-08-05 DIAGNOSIS — R7989 Other specified abnormal findings of blood chemistry: Secondary | ICD-10-CM

## 2021-08-19 ENCOUNTER — Other Ambulatory Visit: Payer: Self-pay | Admitting: Gastroenterology

## 2021-08-19 DIAGNOSIS — R160 Hepatomegaly, not elsewhere classified: Secondary | ICD-10-CM

## 2021-08-19 DIAGNOSIS — R935 Abnormal findings on diagnostic imaging of other abdominal regions, including retroperitoneum: Secondary | ICD-10-CM

## 2021-08-30 ENCOUNTER — Other Ambulatory Visit: Payer: Self-pay

## 2021-08-30 ENCOUNTER — Ambulatory Visit
Admission: RE | Admit: 2021-08-30 | Discharge: 2021-08-30 | Disposition: A | Discharge status: Home / Self Care | Payer: BC Managed Care – PPO | Source: Ambulatory Visit | Attending: Gastroenterology | Admitting: Gastroenterology

## 2021-08-30 DIAGNOSIS — R160 Hepatomegaly, not elsewhere classified: Secondary | ICD-10-CM

## 2021-08-30 DIAGNOSIS — R935 Abnormal findings on diagnostic imaging of other abdominal regions, including retroperitoneum: Secondary | ICD-10-CM

## 2021-08-30 MED ORDER — GADOBENATE DIMEGLUMINE 529 MG/ML IV SOLN
19.0000 mL | Freq: Once | INTRAVENOUS | Status: AC | PRN
  Administered 2021-08-30: 19 mL via INTRAVENOUS

## 2022-06-09 ENCOUNTER — Other Ambulatory Visit (HOSPITAL_COMMUNITY): Payer: Self-pay

## 2022-12-21 ENCOUNTER — Other Ambulatory Visit (HOSPITAL_COMMUNITY): Payer: Self-pay

## 2022-12-21 MED ORDER — LISDEXAMFETAMINE DIMESYLATE 70 MG PO CAPS
70.0000 mg | ORAL_CAPSULE | Freq: Every morning | ORAL | 0 refills | Status: DC
  Filled 2022-12-21: qty 30, 30d supply, fill #0

## 2023-01-18 ENCOUNTER — Other Ambulatory Visit (HOSPITAL_BASED_OUTPATIENT_CLINIC_OR_DEPARTMENT_OTHER): Payer: Self-pay

## 2023-01-18 MED ORDER — LISDEXAMFETAMINE DIMESYLATE 70 MG PO CAPS
70.0000 mg | ORAL_CAPSULE | Freq: Every morning | ORAL | 0 refills | Status: DC
  Filled 2023-01-18: qty 20, 20d supply, fill #0
  Filled 2023-01-19: qty 10, 10d supply, fill #0

## 2023-01-19 ENCOUNTER — Other Ambulatory Visit (HOSPITAL_BASED_OUTPATIENT_CLINIC_OR_DEPARTMENT_OTHER): Payer: Self-pay

## 2023-02-17 ENCOUNTER — Other Ambulatory Visit (HOSPITAL_COMMUNITY): Payer: Self-pay

## 2023-02-17 MED ORDER — LISDEXAMFETAMINE DIMESYLATE 70 MG PO CAPS
ORAL_CAPSULE | ORAL | 0 refills | Status: DC
  Filled 2023-02-17: qty 30, 30d supply, fill #0

## 2023-02-18 ENCOUNTER — Other Ambulatory Visit (HOSPITAL_COMMUNITY): Payer: Self-pay

## 2023-02-19 ENCOUNTER — Other Ambulatory Visit (HOSPITAL_COMMUNITY): Payer: Self-pay

## 2023-02-23 ENCOUNTER — Other Ambulatory Visit (HOSPITAL_COMMUNITY): Payer: Self-pay

## 2023-03-22 ENCOUNTER — Other Ambulatory Visit (HOSPITAL_COMMUNITY): Payer: Self-pay

## 2023-03-22 MED ORDER — LISDEXAMFETAMINE DIMESYLATE 70 MG PO CAPS
70.0000 mg | ORAL_CAPSULE | Freq: Every morning | ORAL | 0 refills | Status: DC
  Filled 2023-03-22: qty 30, 30d supply, fill #0

## 2023-04-20 ENCOUNTER — Other Ambulatory Visit (HOSPITAL_COMMUNITY): Payer: Self-pay

## 2023-04-20 MED ORDER — LISDEXAMFETAMINE DIMESYLATE 70 MG PO CAPS
ORAL_CAPSULE | ORAL | 0 refills | Status: AC
  Filled 2023-04-20: qty 30, 30d supply, fill #0

## 2023-05-14 ENCOUNTER — Other Ambulatory Visit (HOSPITAL_COMMUNITY): Payer: Self-pay

## 2023-05-14 MED ORDER — LISDEXAMFETAMINE DIMESYLATE 70 MG PO CAPS
70.0000 mg | ORAL_CAPSULE | Freq: Every morning | ORAL | 0 refills | Status: AC
  Filled 2023-05-24: qty 30, 30d supply, fill #0

## 2023-05-14 MED ORDER — LISDEXAMFETAMINE DIMESYLATE 70 MG PO CAPS
70.0000 mg | ORAL_CAPSULE | Freq: Every morning | ORAL | 0 refills | Status: AC
  Filled 2023-08-30: qty 30, 30d supply, fill #0

## 2023-05-14 MED ORDER — LISDEXAMFETAMINE DIMESYLATE 70 MG PO CAPS
70.0000 mg | ORAL_CAPSULE | Freq: Every morning | ORAL | 0 refills | Status: DC
  Filled 2023-06-25 – 2023-09-30 (×2): qty 30, 30d supply, fill #0

## 2023-05-24 ENCOUNTER — Other Ambulatory Visit (HOSPITAL_COMMUNITY): Payer: Self-pay

## 2023-06-25 ENCOUNTER — Other Ambulatory Visit (HOSPITAL_COMMUNITY): Payer: Self-pay

## 2023-06-25 ENCOUNTER — Other Ambulatory Visit: Payer: Self-pay

## 2023-06-29 ENCOUNTER — Other Ambulatory Visit (HOSPITAL_COMMUNITY): Payer: Self-pay

## 2023-07-01 ENCOUNTER — Other Ambulatory Visit (HOSPITAL_COMMUNITY): Payer: Self-pay

## 2023-07-28 ENCOUNTER — Other Ambulatory Visit (HOSPITAL_COMMUNITY): Payer: Self-pay

## 2023-07-28 MED ORDER — LISDEXAMFETAMINE DIMESYLATE 70 MG PO CAPS
70.0000 mg | ORAL_CAPSULE | Freq: Every morning | ORAL | 0 refills | Status: AC
  Filled 2023-07-29: qty 30, 30d supply, fill #0

## 2023-07-29 ENCOUNTER — Other Ambulatory Visit (HOSPITAL_COMMUNITY): Payer: Self-pay

## 2023-07-29 ENCOUNTER — Other Ambulatory Visit: Payer: Self-pay

## 2023-07-30 ENCOUNTER — Other Ambulatory Visit (HOSPITAL_COMMUNITY): Payer: Self-pay

## 2023-08-30 ENCOUNTER — Other Ambulatory Visit (HOSPITAL_COMMUNITY): Payer: Self-pay

## 2023-09-29 ENCOUNTER — Other Ambulatory Visit (HOSPITAL_COMMUNITY): Payer: Self-pay

## 2023-09-30 ENCOUNTER — Other Ambulatory Visit (HOSPITAL_COMMUNITY): Payer: Self-pay

## 2023-10-12 ENCOUNTER — Other Ambulatory Visit (HOSPITAL_COMMUNITY): Payer: Self-pay

## 2023-10-12 MED ORDER — LISDEXAMFETAMINE DIMESYLATE 70 MG PO CAPS
70.0000 mg | ORAL_CAPSULE | Freq: Every morning | ORAL | 0 refills | Status: DC
  Filled 2023-11-01: qty 30, 30d supply, fill #0

## 2023-11-01 ENCOUNTER — Other Ambulatory Visit (HOSPITAL_COMMUNITY): Payer: Self-pay

## 2023-11-02 ENCOUNTER — Other Ambulatory Visit (HOSPITAL_COMMUNITY): Payer: Self-pay

## 2023-12-01 ENCOUNTER — Other Ambulatory Visit (HOSPITAL_COMMUNITY): Payer: Self-pay

## 2023-12-01 MED ORDER — LISDEXAMFETAMINE DIMESYLATE 70 MG PO CAPS
70.0000 mg | ORAL_CAPSULE | Freq: Every morning | ORAL | 0 refills | Status: AC
  Filled 2023-12-01: qty 30, 30d supply, fill #0

## 2023-12-13 ENCOUNTER — Other Ambulatory Visit (HOSPITAL_COMMUNITY): Payer: Self-pay

## 2023-12-13 MED ORDER — LISDEXAMFETAMINE DIMESYLATE 70 MG PO CAPS
ORAL_CAPSULE | ORAL | 0 refills | Status: AC
  Filled 2024-02-28: qty 30, 30d supply, fill #0

## 2023-12-13 MED ORDER — LISDEXAMFETAMINE DIMESYLATE 70 MG PO CAPS
ORAL_CAPSULE | ORAL | 0 refills | Status: AC
  Filled 2023-12-30: qty 30, 30d supply, fill #0

## 2023-12-13 MED ORDER — LISDEXAMFETAMINE DIMESYLATE 70 MG PO CAPS
70.0000 mg | ORAL_CAPSULE | Freq: Every morning | ORAL | 0 refills | Status: AC
  Filled 2024-01-28: qty 30, 30d supply, fill #0

## 2023-12-30 ENCOUNTER — Other Ambulatory Visit (HOSPITAL_COMMUNITY): Payer: Self-pay

## 2024-01-28 ENCOUNTER — Other Ambulatory Visit (HOSPITAL_COMMUNITY): Payer: Self-pay

## 2024-01-31 ENCOUNTER — Other Ambulatory Visit (HOSPITAL_COMMUNITY): Payer: Self-pay

## 2024-02-28 ENCOUNTER — Other Ambulatory Visit (HOSPITAL_COMMUNITY): Payer: Self-pay

## 2024-03-30 ENCOUNTER — Other Ambulatory Visit (HOSPITAL_COMMUNITY): Payer: Self-pay

## 2024-03-30 MED ORDER — LISDEXAMFETAMINE DIMESYLATE 70 MG PO CAPS
70.0000 mg | ORAL_CAPSULE | Freq: Every morning | ORAL | 0 refills | Status: DC
  Filled 2024-03-30: qty 30, 30d supply, fill #0

## 2024-04-28 ENCOUNTER — Other Ambulatory Visit (HOSPITAL_BASED_OUTPATIENT_CLINIC_OR_DEPARTMENT_OTHER): Payer: Self-pay

## 2024-04-28 ENCOUNTER — Other Ambulatory Visit (HOSPITAL_COMMUNITY): Payer: Self-pay

## 2024-04-28 MED ORDER — LISDEXAMFETAMINE DIMESYLATE 70 MG PO CAPS
70.0000 mg | ORAL_CAPSULE | Freq: Every morning | ORAL | 0 refills | Status: DC
  Filled 2024-04-28: qty 30, 30d supply, fill #0

## 2024-05-29 ENCOUNTER — Other Ambulatory Visit (HOSPITAL_COMMUNITY): Payer: Self-pay

## 2024-05-29 MED ORDER — LISDEXAMFETAMINE DIMESYLATE 70 MG PO CAPS
70.0000 mg | ORAL_CAPSULE | Freq: Every morning | ORAL | 0 refills | Status: DC
  Filled 2024-05-29: qty 30, 30d supply, fill #0

## 2024-05-30 ENCOUNTER — Other Ambulatory Visit (HOSPITAL_COMMUNITY): Payer: Self-pay

## 2024-06-29 ENCOUNTER — Other Ambulatory Visit (HOSPITAL_COMMUNITY): Payer: Self-pay

## 2024-06-30 ENCOUNTER — Other Ambulatory Visit (HOSPITAL_COMMUNITY): Payer: Self-pay

## 2024-07-12 ENCOUNTER — Other Ambulatory Visit (HOSPITAL_COMMUNITY): Payer: Self-pay

## 2024-07-12 MED ORDER — LISDEXAMFETAMINE DIMESYLATE 70 MG PO CAPS: 70.0000 mg | ORAL_CAPSULE | Freq: Every morning | ORAL | 0 refills | Status: AC

## 2024-09-25 ENCOUNTER — Other Ambulatory Visit (HOSPITAL_COMMUNITY): Payer: Self-pay

## 2024-09-25 MED ORDER — LISDEXAMFETAMINE DIMESYLATE 70 MG PO CAPS
70.0000 mg | ORAL_CAPSULE | Freq: Every morning | ORAL | 0 refills | Status: AC
  Filled 2024-09-25 – 2024-09-26 (×2): qty 30, 30d supply, fill #0

## 2024-09-26 ENCOUNTER — Other Ambulatory Visit (HOSPITAL_COMMUNITY): Payer: Self-pay

## 2024-09-28 ENCOUNTER — Other Ambulatory Visit (HOSPITAL_COMMUNITY): Payer: Self-pay

## 2024-10-05 ENCOUNTER — Other Ambulatory Visit (HOSPITAL_COMMUNITY): Payer: Self-pay
